# Patient Record
Sex: Female | Born: 1945 | Race: White | Hispanic: No | Marital: Married | Smoking: Never smoker
Health system: Southern US, Community
[De-identification: ages and names within clinical notes are randomized; demographics above are authoritative.]

## PROBLEM LIST (undated history)

## (undated) DIAGNOSIS — H269 Unspecified cataract: Secondary | ICD-10-CM

## (undated) DIAGNOSIS — M8580 Other specified disorders of bone density and structure, unspecified site: Secondary | ICD-10-CM

## (undated) DIAGNOSIS — E785 Hyperlipidemia, unspecified: Secondary | ICD-10-CM

## (undated) DIAGNOSIS — F32A Depression, unspecified: Secondary | ICD-10-CM

## (undated) DIAGNOSIS — J189 Pneumonia, unspecified organism: Secondary | ICD-10-CM

## (undated) DIAGNOSIS — G2581 Restless legs syndrome: Secondary | ICD-10-CM

## (undated) DIAGNOSIS — R7302 Impaired glucose tolerance (oral): Secondary | ICD-10-CM

## (undated) DIAGNOSIS — I10 Essential (primary) hypertension: Secondary | ICD-10-CM

## (undated) DIAGNOSIS — M1990 Unspecified osteoarthritis, unspecified site: Secondary | ICD-10-CM

## (undated) HISTORY — DX: Restless legs syndrome: G25.81

## (undated) HISTORY — DX: Depression, unspecified: F32.A

## (undated) HISTORY — DX: Unspecified osteoarthritis, unspecified site: M19.90

## (undated) HISTORY — DX: Hyperlipidemia, unspecified: E78.5

## (undated) HISTORY — PX: CARPAL TUNNEL RELEASE: SHX101

## (undated) HISTORY — PX: SHOULDER SURGERY: SHX246

## (undated) HISTORY — DX: Impaired glucose tolerance (oral): R73.02

## (undated) HISTORY — PX: EYE SURGERY: SHX253

## (undated) HISTORY — DX: Pneumonia, unspecified organism: J18.9

## (undated) HISTORY — PX: KNEE SURGERY: SHX244

## (undated) HISTORY — DX: Unspecified cataract: H26.9

## (undated) HISTORY — DX: Other specified disorders of bone density and structure, unspecified site: M85.80

## (undated) HISTORY — DX: Essential (primary) hypertension: I10

---

## 1998-10-13 ENCOUNTER — Ambulatory Visit (HOSPITAL_COMMUNITY): Admission: RE | Admit: 1998-10-13 | Discharge: 1998-10-13 | Payer: Self-pay | Admitting: Obstetrics & Gynecology

## 1998-11-27 ENCOUNTER — Other Ambulatory Visit: Admission: RE | Admit: 1998-11-27 | Discharge: 1998-11-27 | Payer: Self-pay | Admitting: Obstetrics & Gynecology

## 1999-04-15 ENCOUNTER — Other Ambulatory Visit: Admission: RE | Admit: 1999-04-15 | Discharge: 1999-04-15 | Payer: Self-pay | Admitting: Obstetrics & Gynecology

## 1999-08-30 ENCOUNTER — Other Ambulatory Visit: Admission: RE | Admit: 1999-08-30 | Discharge: 1999-08-30 | Payer: Self-pay | Admitting: Obstetrics and Gynecology

## 1999-11-16 ENCOUNTER — Encounter: Payer: Self-pay | Admitting: Obstetrics and Gynecology

## 1999-11-16 ENCOUNTER — Encounter: Admission: RE | Admit: 1999-11-16 | Discharge: 1999-11-16 | Payer: Self-pay | Admitting: Obstetrics and Gynecology

## 1999-12-01 ENCOUNTER — Encounter: Admission: RE | Admit: 1999-12-01 | Discharge: 1999-12-01 | Payer: Self-pay | Admitting: Obstetrics and Gynecology

## 1999-12-01 ENCOUNTER — Encounter: Payer: Self-pay | Admitting: Obstetrics and Gynecology

## 2000-10-10 ENCOUNTER — Other Ambulatory Visit: Admission: RE | Admit: 2000-10-10 | Discharge: 2000-10-10 | Payer: Self-pay | Admitting: Obstetrics and Gynecology

## 2001-02-23 ENCOUNTER — Encounter: Admission: RE | Admit: 2001-02-23 | Discharge: 2001-02-23 | Payer: Self-pay | Admitting: Obstetrics and Gynecology

## 2001-02-23 ENCOUNTER — Encounter: Payer: Self-pay | Admitting: Obstetrics and Gynecology

## 2001-08-31 ENCOUNTER — Other Ambulatory Visit: Admission: RE | Admit: 2001-08-31 | Discharge: 2001-08-31 | Payer: Self-pay | Admitting: Obstetrics and Gynecology

## 2001-09-22 ENCOUNTER — Encounter: Admission: RE | Admit: 2001-09-22 | Discharge: 2001-09-22 | Payer: Self-pay | Admitting: Obstetrics and Gynecology

## 2001-09-22 ENCOUNTER — Encounter: Payer: Self-pay | Admitting: Obstetrics and Gynecology

## 2001-12-22 ENCOUNTER — Observation Stay (HOSPITAL_COMMUNITY): Admission: RE | Admit: 2001-12-22 | Discharge: 2001-12-24 | Payer: Self-pay | Admitting: Obstetrics and Gynecology

## 2001-12-22 ENCOUNTER — Encounter (INDEPENDENT_AMBULATORY_CARE_PROVIDER_SITE_OTHER): Payer: Self-pay | Admitting: Specialist

## 2002-01-22 ENCOUNTER — Encounter: Admission: RE | Admit: 2002-01-22 | Discharge: 2002-01-22 | Payer: Self-pay | Admitting: *Deleted

## 2002-01-22 ENCOUNTER — Encounter: Payer: Self-pay | Admitting: *Deleted

## 2002-09-18 ENCOUNTER — Other Ambulatory Visit: Admission: RE | Admit: 2002-09-18 | Discharge: 2002-09-18 | Payer: Self-pay | Admitting: Obstetrics and Gynecology

## 2002-10-23 ENCOUNTER — Ambulatory Visit (HOSPITAL_COMMUNITY): Admission: RE | Admit: 2002-10-23 | Discharge: 2002-10-23 | Payer: Self-pay | Admitting: Gastroenterology

## 2004-09-01 ENCOUNTER — Encounter: Admission: RE | Admit: 2004-09-01 | Discharge: 2004-09-01 | Payer: Self-pay | Admitting: Obstetrics and Gynecology

## 2005-11-29 ENCOUNTER — Encounter: Admission: RE | Admit: 2005-11-29 | Discharge: 2005-11-29 | Payer: Self-pay | Admitting: Obstetrics and Gynecology

## 2008-04-01 ENCOUNTER — Encounter: Admission: RE | Admit: 2008-04-01 | Discharge: 2008-04-01 | Payer: Self-pay | Admitting: Obstetrics and Gynecology

## 2010-06-23 ENCOUNTER — Encounter: Admission: RE | Admit: 2010-06-23 | Discharge: 2010-06-23 | Payer: Self-pay | Admitting: Obstetrics and Gynecology

## 2011-01-10 ENCOUNTER — Encounter: Payer: Self-pay | Admitting: Obstetrics and Gynecology

## 2011-01-20 ENCOUNTER — Ambulatory Visit: Payer: Self-pay | Admitting: Ophthalmology

## 2011-05-07 NOTE — Op Note (Signed)
   NAME:  Charlene Barnes, Charlene Barnes                         ACCOUNT NO.:  [id number]   MEDICAL RECORD NO.:  [id number]                   PATIENT TYPE:  AMB   LOCATION:  ENDO                                 FACILITY:  [facility]   PHYSICIAN:  Anselmo Rod, M.D.               DATE OF BIRTH:  08/07/1946   DATE OF PROCEDURE:  10/23/2002  DATE OF DISCHARGE:                                 OPERATIVE REPORT   PROCEDURE:  Screening colonoscopy.   ENDOSCOPE:  Anselmo Rod, M.D.   INSTRUMENT USED:  Olympus video colonoscope.   INDICATION FOR PROCEDURE:  A 66 year old white female with a history of  rectal bleeding.  Rule out colonic polyps, masses, hemorrhoids, etc.   PREPROCEDURE PREPARATION:  Informed consent was procured from the patient.  The patient was fasted for eight hours prior to the procedure and prepped  with a bottle of magnesium citrate and a gallon of NuLytely the night prior  to the procedure.   PREPROCEDURE PREPARATION:  VITAL SIGNS:  The patient had stable vital signs.  NECK:  Supple.  CHEST:  Clear to auscultation.  S1, S2 regular.  ABDOMEN:  Soft with normal bowel sounds.   DESCRIPTION OF PROCEDURE:  The patient was placed in the left lateral  decubitus position and sedated with 60 mg of Demerol and 6 mg of Versed  intravenously.  Once the patient was adequately sedate and maintained on low-  flow oxygen and continuous cardiac monitoring, the Olympus video colonoscope  was advanced from the rectum to the cecum and terminal ileum without  difficulty.  The entire colonic mucosa appeared healthy with a normal  vascular pattern.  No masses, polyps, erosions, ulcerations, or diverticula  were seen.  The appendiceal orifice and the ileocecal valve were clearly  visualized and photographed.  Small, nonbleeding internal hemorrhoids were  seen on retroflexion.   IMPRESSION:  Normal colonoscopy up to the terminal ileum except for small  internal hemorrhoids.   RECOMMENDATIONS:  1.  A high-fiber diet has been discussed with the patient in great detail,     and brochures have been given to her for her education.  2.     A repeat colorectal cancer screening is recommended in the next five to 10     years unless the patient develops any abnormal symptoms in the interim.  3. Outpatient follow-up on a p.r.n. basis.                                               Anselmo Rod, M.D.    JNM/MEDQ  D:  10/23/2002  T:  10/23/2002  Job:  [id number]   cc:   Christella Noa, M.D.  [address]  [place], [place] [address]  Fax: [phone]

## 2011-05-07 NOTE — H&P (Signed)
[facility]  Patient:    Charlene Barnes, ARMIJO Visit Number: [id number] MRN: [id number]          Service Type: Attending:  Alvino Chapel, M.D. Dictated by:   Alvino Chapel, M.D. Adm. Date:  12/22/01                           History and Physical  SURGERY TIME:  December 22, 2000, at 7:30 a.m.  HISTORY OF PRESENT ILLNESS:  The patient is a 65 year old G2, P2, who has a long history of abnormal postmenopausal bleeding and a fibroid uterus. Approximately three years ago, the patient began to have spotting for the majority of her days each month and was tried on multiple medical regimens without success.  An FSH level confirmed that the patient was postmenopausal; however, she continues to have spotting for 20 out of 30 days of each month. An endometrial biopsy was performed and was negative.  A ultrasound was performed and showed a fibroid uterus measuring approximately 10 cm.  The ovaries appeared normal.  Given the patients ongoing problems and failure of medical therapy, she desires definitive surgical treatment with a laparoscopic-assisted vaginal hysterectomy and bilateral salpingo-oophorectomy.  The patient also reports some increasing symptoms of pelvic pressure and constipation.  Upon examination, it is noted that she has a moderate cystocele and small rectocele.  She would like these corrected at the time of surgery.  PAST MEDICAL HISTORY:  Significant only for restless leg syndrome.  PAST GYNECOLOGIC HISTORY:  Remote history of abnormal Pap smears; however, these normalized spontaneously.  PAST OBSTETRICAL HISTORY:  She has had two normal spontaneous vaginal deliveries.  PAST SURGICAL HISTORY:  None.  CURRENT MEDICATIONS:  Mirapex and Paxil.  FAMILY HISTORY:  There is no breast cancer or colon cancer in the family as well as no heart disease.  ALLERGIES:  None.  PHYSICAL EXAMINATION:  VITAL SIGNS:  Weight 165 pounds, blood  pressure 130/90.  BREASTS:  No masses, discharge, or adenopathy noted.  CARDIAC:  Regular rate and rhythm.  LUNGS:  Clear.  ABDOMEN:  Soft and nontender.  PELVIC:  The patient does have a moderate cystocele which enlarges with Valsalva.  The uterus itself is upper limits of normal, and the adnexa have no significant masses.  RECTAL:  There is also a small rectocele palpable on rectal exam.  PLAN:  All options were discussed with the patient, and she desires to proceed with definitive surgical therapy with a hysterectomy.  Given that she is over 35 years old and postmenopausal, she also wishes removal of her ovaries.  The patient was counselled as to the risks and benefits of a laparoscopic-assisted vaginal hysterectomy including bleeding, infection, and possible damage to bowel and bladder.  The patient understands these risks and desires to proceed.  The patient was also counselled as to the risks of proceeding with an anterior posterior repair; however, given her increase in symptoms over the past few months, she desires to proceed with this procedure as well. Dictated by:   Alvino Chapel, M.D. Attending:  Alvino Chapel, M.D. DD:  12/21/01 TD:  12/21/01 Job: [id number]

## 2011-05-07 NOTE — Op Note (Signed)
[facility]  Patient:    Charlene Barnes, Charlene Barnes Visit Number: [id number] MRN: [id number]          Service Type: SUR Location: 4W 0457 02 Attending Physician:  Oliver Pila Dictated by:   Alvino Chapel, M.D. Proc. Date: 12/22/01 Admit Date:  12/22/2001                             Operative Report  PREOPERATIVE DIAGNOSES: 1. Fibroid uterus. 2. Postmenopausal bleeding. 3. Cystocele. 4. Rectocele.  POSTOPERATIVE DIAGNOSES: 1. Fibroid uterus. 2. Postmenopausal bleeding. 3. Cystocele. 4. Rectocele.  PROCEDURE: 1. Laparoscopic assisted vaginal hysterectomy with bilateral    salpingo-oophorectomy. 2. Anterior and posterior repair.  SURGEON:  Alvino Chapel, M.D.  ASSISTANT:  Zenaida Niece, M.D.  ANESTHESIA:  General.  FINDINGS:  There is a fibroid uterus, 10 to 12 weeks in size which was significantly retroverted. Normal ovaries and tubes were noted bilaterally. There was a moderate cystocele and a mild rectocele.  ESTIMATED BLOOD LOSS:  250 cc.  URINE OUTPUT:  600 cc, clear.  INTRAVENOUS FLUIDS:  2700 cc lactated Ringers.  PATHOLOGY:  Uterus, tubes, ovaries and cervix were sent to pathology.  DESCRIPTION OF PROCEDURE:  The patient was taken to the operating room where general anesthesia was obtained without difficulty. She was then prepped and draped in the normal sterile fashion in the dorsal lithotomy position. Speculum was placed within the vagina and a Hulka tenaculum placed within the cervix for uterine manipulation. The speculum was then removed and attention then turned to the patients abdomen. A small infraumbilical incision was made with the scalpel and the Veress needle introduced into this incision. Intraperitoneal placement was confirmed by both injection and aspiration with normal saline. Gas flow was then applied and pneumoperitoneum obtained with 3.5 liters of CO2 gas. The patient was then placed in  Trendelenburg and the trocar placed through the incision without difficulty. The camera was then introduced and pelvis and abdomen were inspected with the findings as previously stated. Two additional ports were then placed in the lower quadrants under direct visualization, 5 mm in size. Each area was injected with 0.25% Marcaine prior to placement of trocars. Thus with access obtained, the patients right fallopian tube and ovary were grasped medially and the infundibulopelvic ligament and broad ligament were taken down sequentially with the bipolar cutting cautery unit. This was taken down through the round ligament as well and the bladder flap was created with the instrument. In a similar fashion, the left ovary and tube were grasped and pulled medially. This was then also taken down with bipolar cutting cautery unit across the infundibulopelvic, broad, and round ligaments. A bladder flap was also created on this side and the bladder was able to be pushed inferior to the uterus. With no active bleeding noted, the camera was removed from the abdomen. A clean towel was placed across the trocars.  Attention was then turned to the patients vagina where the cervix was grasped with Christella Hartigan tenaculum and injected circumferentially with a dilute solution of Pitressin. The cervix was then incised in a circumferential fashion with Bovie cautery. The Mayo scissors were then used to continue to dissect away the overlying vaginal mucosa from the cervix. The posterior cul-de-sac was entered with Mayo scissors and the Bonnano speculum within the posterior cuff. The slightly curved Zeppelin clamps were then used to clamp each uterosacral ligaments which was transected and secured with a  0 Vicryl suture. The remainder of the cardinal and broad ligament was then taken down with slight curved Zeppelin clamps and sequential bites with each step secured with 0 Vicryl. This was taken until the uterus was  completely free and then removed from the abdomen. The small areas of bleeding along the angles of the cuff were controlled with figure-of-eight sutures of 0 Vicryl. The cuff itself was stitched in a running locked stitch along the posterior edge for hemostasis. The peritoneum was then closed in a pursestring fashion with 0 Vicryl and the cuff was closed with interrupted sutures of 0 Vicryl with good hemostasis obtained.  The anterior wall of the vagina was then grasped just anterior to the closed vaginal cuff and was opened medially with the Metzenbaum scissors. The mucosa was opened up to approximately 1 cm below the urethral meatus and the pubovesical fascia was trimmed away from the mucosa bilaterally. Several interrupted sutures of 0 Vicryl were then placed in the pubovesical fascia to reduce the cystocele and the vaginal mucosa was closed with a running locked suture of 0 swaged-on.  Attention was then turned to the posterior surface of the vagina and the speculum was removed. This was injected with a dilute solution of Pitressin and grasped with Allis clamps at the introitus. This was then opened and the vaginal mucosa was incised along the median of the posterior vaginal wall. These flaps were elevated and the underlying fascia trimmed away with Mayo scissors. Several sutures were placed to reduce the rectocele in the pubovesical fascia of 0 interrupted Vicryl and the excess mucosa trimmed away. The mucosa itself was then closed with 0 Vicryl in a running locked fashion. Two hymenal tags at the perineum were removed and closed.  At this point, the vagina was packed with iodoform gauze for hemostasis. There was no active bleeding noted along the anterior-posterior surface or the vaginal cuff itself. Attention was then returned to the patients abdomen where gas flow was again applied and pneumoperitoneum obtained. The vaginal cuff was inspected closely and was found to be  hemostatic. All the infundibulopelvic and round ligaments were also inspected and found to be  hemostatic. There was one small hematoma along the left pelvic sidewall, however, this was small and did not appear to be expanding. Thus, with hemostasis confirmed, all the 5 mm trocars were removed under direct visualization with no bleeding noted at the trocar sites. The pneumoperitoneum was then evacuated through the umbilical trocar and this, too, was removed under direct visualization. The umbilical incision was then closed with 0 Vicryl and two subcutaneous stitches and the skin was closed with 3-0 Vicryl in a subcuticular stitch at all sites. Sponge lap and needle counts were correct x 2 and the patient was taken to the recovery room in stable condition. Dictated by:   Alvino Chapel, M.D. Attending Physician:  Oliver Pila DD:  12/22/01 TD:  12/22/01 Job: [id number]

## 2011-05-07 NOTE — Discharge Summary (Signed)
[facility]  Patient:    BAILEI, BUIST Visit Number: [id number] MRN: [id number]          Service Type: SUR Location: 4W 0457 02 Attending Physician:  Oliver Pila Dictated by:   Malachi Pro. Ambrose Mantle, M.D. Admit Date:  12/22/2001 Discharge Date: 12/24/2001                             Discharge Summary  HOSPITAL COURSE:  This is a 65 year old, white female admitted with cystocele, fibroid uterus, postmenopausal bleeding and rectocele for a laparoscopic-assisted vaginal hysterectomy, bilateral salpingo-oophorectomy with A&P repair.  The procedure was done by Dr. Senaida Ores with Dr. Jackelyn Knife assisting under general anesthesia with findings of a 10- to 12-week size fibroid uterus, normal tubes and ovaries, moderate cystocele and mild rectocele.  Postoperatively, the patient did fine; however, she did have a drop in blood pressure postoperatively that was attributed to the Dilaudid pump.  Once she came off the Dilaudid pump, she did better.  She had no significant problems.  On the day of discharge, her temperature was actually 100.4 degrees and when it was rechecked, it was 97.  The patient is having no symptoms and she is just advised to watch her temperature elevation and call if her temperature goes above 100.4 degrees.  LABORATORY DATA AND X-RAY FINDINGS:  White count 6900, hemoglobin 13.0, hematocrit 38.2, platelet count 331,000 with 55 segs, 35 lymphs, 8 monos, 1 eos and 1 baso.  Followup hemoglobin was 11.2, hematocrit 32.6.  EKG was normal except for a sinus bradycardia.  Pathology report is pending.  DISCHARGE DIAGNOSES: 1. Leiomyomata uteri. 2. Cystocele and rectocele. 3. Postmenopausal bleeding.  PROCEDURE:  Laparoscopic-assisted vaginal hysterectomy with bilateral salpingo-oophorectomy with anterior and posterior repair.  CONDITION ON DISCHARGE:  Improved.  DIET:  Regular.  ACTIVITY:  No vaginal entrance.  No heavy lifting  or strenuous activity.  SPECIAL INSTRUCTIONS:  Call if her temperature goes above 100.4 degrees and return to the office in two weeks for followup examination.  DISCHARGE MEDICATIONS: 1. Percocet. 2. Motrin. Dictated by:   Malachi Pro. Ambrose Mantle, M.D. Attending Physician:  Oliver Pila DD:  12/24/01 TD:  12/25/01 Job: [id number]

## 2013-08-27 ENCOUNTER — Other Ambulatory Visit: Payer: Self-pay

## 2013-08-27 DIAGNOSIS — Z1231 Encounter for screening mammogram for malignant neoplasm of breast: Secondary | ICD-10-CM

## 2013-09-10 ENCOUNTER — Ambulatory Visit
Admission: RE | Admit: 2013-09-10 | Discharge: 2013-09-10 | Disposition: A | Discharge status: Home / Self Care | Payer: BC Managed Care – PPO | Source: Ambulatory Visit

## 2013-09-10 DIAGNOSIS — Z1231 Encounter for screening mammogram for malignant neoplasm of breast: Secondary | ICD-10-CM

## 2013-11-23 ENCOUNTER — Telehealth: Payer: Self-pay | Admitting: Neurology

## 2013-11-23 NOTE — Telephone Encounter (Signed)
calling to get reassigned and scheduled

## 2013-11-26 NOTE — Telephone Encounter (Signed)
Given to Joellen Jersey for reassignment

## 2013-11-29 ENCOUNTER — Telehealth: Payer: Self-pay | Admitting: Diagnostic Neuroimaging

## 2013-11-29 NOTE — Telephone Encounter (Signed)
Left message for patient to call and schedule appointment. Former Dr. Sandria Manly patient, transfer of care to Dr. Marjory Lies.

## 2014-05-29 ENCOUNTER — Telehealth: Payer: Self-pay

## 2014-05-29 NOTE — Telephone Encounter (Signed)
Spoke to patient. Scheduled appt for 05/30/14. Pt was on wait list.

## 2014-05-30 ENCOUNTER — Ambulatory Visit (INDEPENDENT_AMBULATORY_CARE_PROVIDER_SITE_OTHER): Payer: BC Managed Care – PPO | Admitting: Diagnostic Neuroimaging

## 2014-05-30 ENCOUNTER — Encounter (INDEPENDENT_AMBULATORY_CARE_PROVIDER_SITE_OTHER): Payer: Self-pay

## 2014-05-30 ENCOUNTER — Encounter: Payer: Self-pay | Admitting: Diagnostic Neuroimaging

## 2014-05-30 VITALS — BP 141/62 | HR 62 | Temp 101.4°F | Ht 64.0 in | Wt 139.0 lb

## 2014-05-30 DIAGNOSIS — Z726 Gambling and betting: Secondary | ICD-10-CM

## 2014-05-30 DIAGNOSIS — G2581 Restless legs syndrome: Secondary | ICD-10-CM

## 2014-05-30 DIAGNOSIS — F639 Impulse disorder, unspecified: Secondary | ICD-10-CM

## 2014-05-30 DIAGNOSIS — F429 Obsessive-compulsive disorder, unspecified: Secondary | ICD-10-CM

## 2014-05-30 NOTE — Patient Instructions (Signed)
I will refer you to psychiatry.  After other symptoms are stabilized, we may change your RLS medications.

## 2014-05-30 NOTE — Progress Notes (Signed)
[facility]  PATIENT: Charlene Barnes DOB: September 01, 1946  REFERRING CLINICIAN:  HISTORY FROM: patient  REASON FOR VISIT: follow up (Dr. Sandria Manly transfer)   HISTORICAL  CHIEF COMPLAINT:  Chief Complaint  Patient presents with  . Follow-up    RLS    HISTORY OF PRESENT ILLNESS:   UPDATE 05/30/14: Here for follow up. RLS is stable. Still feels some tendency to move at night. Lately, having more urges to shop and gamble. Doesn't know why. Mood is stable. Has been on paxil x 20 yrs (used to get easily tearful at work).   PRIOR HPI (04/24/12, Dr. Sandria Manly): 68 year old right-handed white married female with a many year history of restless leg syndrome followed on a yearly basis. She  had low serum ferritin of 7 and was on iron supplement without significant benefit. 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 She had shopping sprees Her mother had Parkinson's disease and  her brother and maternal cousins have restless leg syndrome. She had augmentation with Sinemet and poor response to Requip. She does well with pramipaxol 1.5 mg once at night and sleeps 4/7 nights. If she waits to late  to take her medicine, it is less effective .When she is exhausted she cannot go to sleep. She denies daytime sleepiness or restless legs. She has periodic leg movements at night. She was tried on Neupro  by Dr. Wylene Simmer, dosage unknown, but  did not take it after 4 days. Her symptoms got worse. She is  taking pramipaxole 1.5 mg at 8 PM. If she develops severe restless legs, she will get out of bedand walk or go into the kitchen and he  becomes take a hot shower. She rubs her legs in lotion at night.Her symptoms are worse when she is tired. She has noted difficulty processing and focusing at work. She notes pain in her upper neck  posteriorly which is not radiating to her arms.   REVIEW OF SYSTEMS: Full 14 system review of systems performed and notable only for as per HPI, otherwise neg.    ALLERGIES: Allergies  Allergen Reactions  . Adhesive [Tape]     HOME MEDICATIONS: No outpatient prescriptions prior to visit.   No facility-administered medications prior to visit.    PAST MEDICAL HISTORY: History reviewed. No pertinent past medical history.  PAST SURGICAL HISTORY: History reviewed. No pertinent past surgical history.  FAMILY HISTORY: Family History  Problem Relation Age of Onset  . Parkinsonism Mother   . Lymphoma Mother   . Other Father     natural  . Restless legs syndrome Brother     SOCIAL HISTORY:  History   Social History  . Marital Status: Married    Spouse Name: Adriana Simas    Number of Children: 2  . Years of Education: College   Occupational History  . retired    Social History Main Topics  . Smoking status: Never Smoker   . Smokeless tobacco: Not on file  . Alcohol Use: Yes     Comment: occasionally  . Drug Use: No  . Sexual Activity: Not on file   Other Topics Concern  . Not on file   Social History Narrative   Patient lives at home with spouse.   Caffeine Use: 1-2 cups daily     PHYSICAL EXAM  Filed Vitals:   05/30/14 1431  BP: 141/62  Pulse: 62  Temp: 101.4 F (38.6 C)  TempSrc: Oral  Height: 5\' 4"  (1.626 m)  Weight: 139 lb (63.05 kg)    Not recorded    Body mass index is 23.85 kg/(m^2).  GENERAL EXAM: Patient is in no distress; well developed, nourished and groomed; neck is supple  CARDIOVASCULAR: Regular rate and rhythm, no murmurs, no carotid bruits  NEUROLOGIC: MENTAL STATUS: awake, alert, language fluent, comprehension intact, naming intact, fund of knowledge appropriate CRANIAL NERVE: no papilledema on fundoscopic exam, pupils equal and reactive to light, visual fields full to confrontation, extraocular muscles intact, no nystagmus, facial sensation and strength symmetric, hearing intact, palate elevates symmetrically, uvula midline, shoulder shrug symmetric, tongue midline. MOTOR: normal bulk and  tone, full strength in the BUE, BLE SENSORY: normal and symmetric to light touch COORDINATION: finger-nose-finger, fine finger movements normal REFLEXES: deep tendon reflexes present and symmetric GAIT/STATION: narrow based gait; romberg is negative    DIAGNOSTIC DATA (LABS, IMAGING, TESTING) - I reviewed patient records, labs, notes, testing and imaging myself where available.  No results found for this basename: WBC, HGB, HCT, MCV, PLT   No results found for this basename: na, k, cl, co2, glucose, bun, creatinine, calcium, prot, albumin, ast, alt, alkphos, bilitot, gfrnonaa, gfraa   No results found for this basename: CHOL, HDL, LDLCALC, LDLDIRECT, TRIG, CHOLHDL   No results found for this basename: HGBA1C   No results found for this basename: VITAMINB12   No results found for this basename: TSH      ASSESSMENT AND PLAN  68 y.o. year old female here with RLS and impulse control problem (gambling, shopping; eating in the past), which may be related to her dopamine agonist + some underlying issues.  PLAN: - psychiatry eval of OCD, impulse control, gambling, shopping problems - advised to reduce pramipexole to 1mg  qhs, but she wants to keep it the same for now  Orders Placed This Encounter  Procedures  . Ambulatory referral to Psychiatry   Return in about 3 months (around 08/30/2014).    Suanne MarkerVIKRAM R. Paisyn Guercio, MD 05/30/2014, 3:22 PM Certified in Neurology, Neurophysiology and Neuroimaging  [facility] [address], [address] [place], [place] [address] [phone]

## 2014-06-03 ENCOUNTER — Telehealth: Payer: Self-pay | Admitting: *Deleted

## 2014-06-03 NOTE — Telephone Encounter (Signed)
Called patient and asked if she could have the office (Urgent Care) where she was diagnosed to fax the report to our office for her chart here, she verbalized understanding and said that she would

## 2014-07-23 ENCOUNTER — Ambulatory Visit: Payer: Self-pay | Admitting: Diagnostic Neuroimaging

## 2015-03-19 ENCOUNTER — Other Ambulatory Visit: Payer: Self-pay

## 2015-03-19 DIAGNOSIS — Z1231 Encounter for screening mammogram for malignant neoplasm of breast: Secondary | ICD-10-CM

## 2015-03-27 ENCOUNTER — Ambulatory Visit: Payer: Self-pay

## 2015-03-27 ENCOUNTER — Ambulatory Visit
Admission: RE | Admit: 2015-03-27 | Discharge: 2015-03-27 | Disposition: A | Discharge status: Home / Self Care | Payer: BLUE CROSS/BLUE SHIELD | Source: Ambulatory Visit

## 2015-03-27 DIAGNOSIS — Z1231 Encounter for screening mammogram for malignant neoplasm of breast: Secondary | ICD-10-CM

## 2016-05-18 ENCOUNTER — Other Ambulatory Visit: Payer: Self-pay

## 2016-05-18 DIAGNOSIS — Z1231 Encounter for screening mammogram for malignant neoplasm of breast: Secondary | ICD-10-CM

## 2016-05-20 ENCOUNTER — Ambulatory Visit
Admission: RE | Admit: 2016-05-20 | Discharge: 2016-05-20 | Disposition: A | Discharge status: Home / Self Care | Payer: Medicare Other | Source: Ambulatory Visit

## 2016-05-20 DIAGNOSIS — Z1231 Encounter for screening mammogram for malignant neoplasm of breast: Secondary | ICD-10-CM

## 2016-05-25 ENCOUNTER — Other Ambulatory Visit: Payer: Self-pay | Admitting: Internal Medicine

## 2016-05-25 DIAGNOSIS — R928 Other abnormal and inconclusive findings on diagnostic imaging of breast: Secondary | ICD-10-CM

## 2016-06-03 ENCOUNTER — Ambulatory Visit
Admission: RE | Admit: 2016-06-03 | Discharge: 2016-06-03 | Disposition: A | Discharge status: Home / Self Care | Payer: Medicare Other | Source: Ambulatory Visit | Attending: Internal Medicine | Admitting: Internal Medicine

## 2016-06-03 DIAGNOSIS — R928 Other abnormal and inconclusive findings on diagnostic imaging of breast: Secondary | ICD-10-CM

## 2017-05-20 ENCOUNTER — Other Ambulatory Visit: Payer: Self-pay | Admitting: Internal Medicine

## 2017-05-20 DIAGNOSIS — Z1231 Encounter for screening mammogram for malignant neoplasm of breast: Secondary | ICD-10-CM

## 2017-05-23 ENCOUNTER — Ambulatory Visit
Admission: RE | Admit: 2017-05-23 | Discharge: 2017-05-23 | Disposition: A | Discharge status: Home / Self Care | Payer: Medicare Other | Source: Ambulatory Visit | Attending: Internal Medicine | Admitting: Internal Medicine

## 2017-05-23 DIAGNOSIS — Z1231 Encounter for screening mammogram for malignant neoplasm of breast: Secondary | ICD-10-CM

## 2018-11-10 ENCOUNTER — Other Ambulatory Visit: Payer: Self-pay | Admitting: Internal Medicine

## 2018-11-10 DIAGNOSIS — Z1231 Encounter for screening mammogram for malignant neoplasm of breast: Secondary | ICD-10-CM

## 2018-12-26 ENCOUNTER — Ambulatory Visit
Admission: RE | Admit: 2018-12-26 | Discharge: 2018-12-26 | Disposition: A | Discharge status: Home / Self Care | Payer: Medicare Other | Source: Ambulatory Visit | Attending: Internal Medicine | Admitting: Internal Medicine

## 2018-12-26 DIAGNOSIS — Z1231 Encounter for screening mammogram for malignant neoplasm of breast: Secondary | ICD-10-CM

## 2019-12-21 HISTORY — PX: KNEE SURGERY: SHX244

## 2020-03-26 IMAGING — MG DIGITAL SCREENING BILATERAL MAMMOGRAM WITH TOMO AND CAD
8 series · 8 of 24 positions shown · non-contrast
Comparison: Previous exam(s).

CLINICAL DATA: Screening.

EXAM:
DIGITAL SCREENING BILATERAL MAMMOGRAM WITH TOMO AND CAD

[R CC synth-2D]
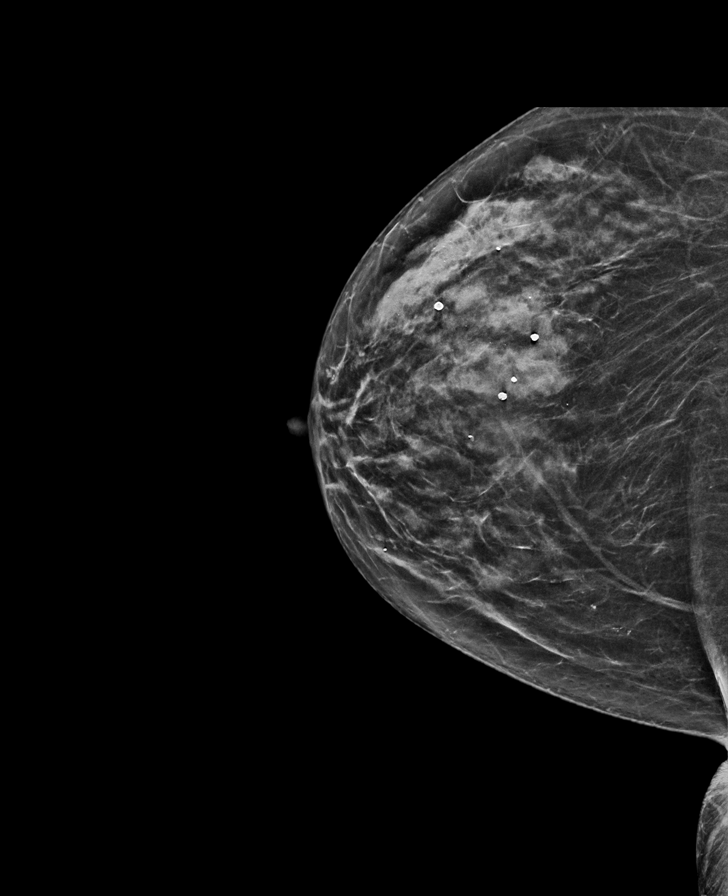

[R MLO synth-2D]
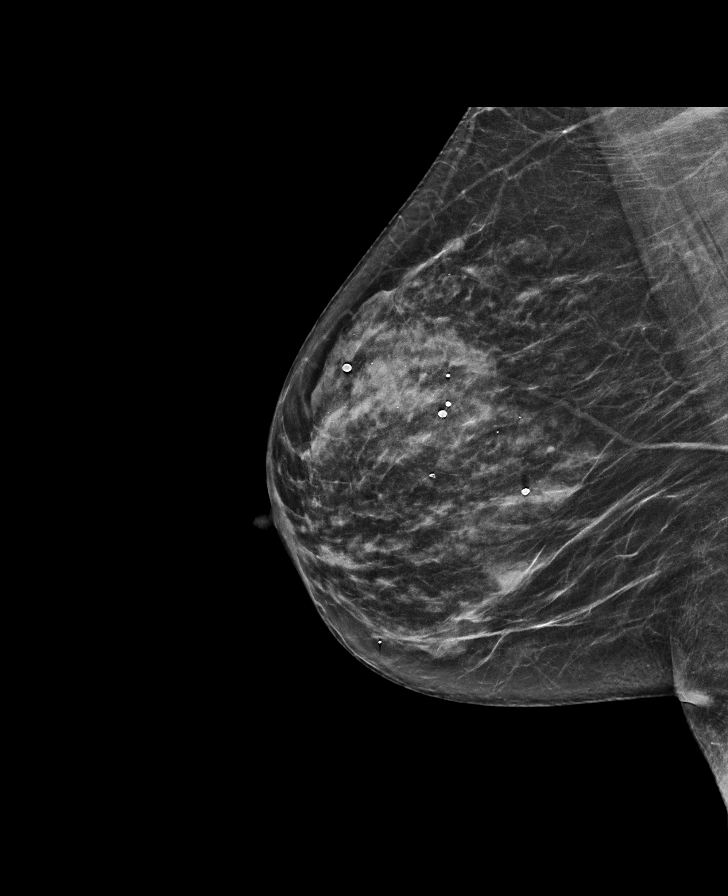

[L MLO synth-2D]
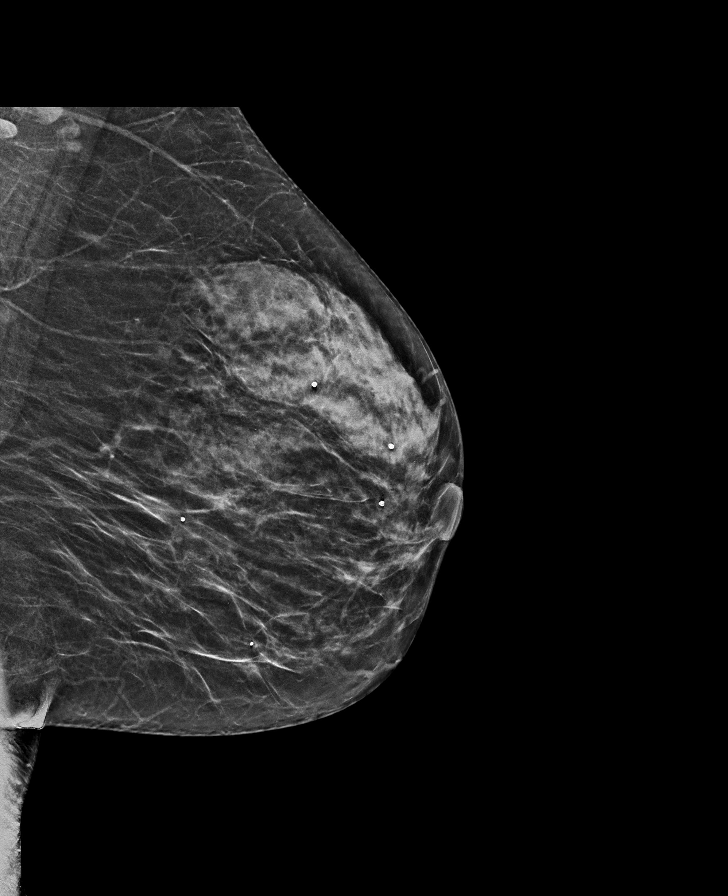

[L CC synth-2D]
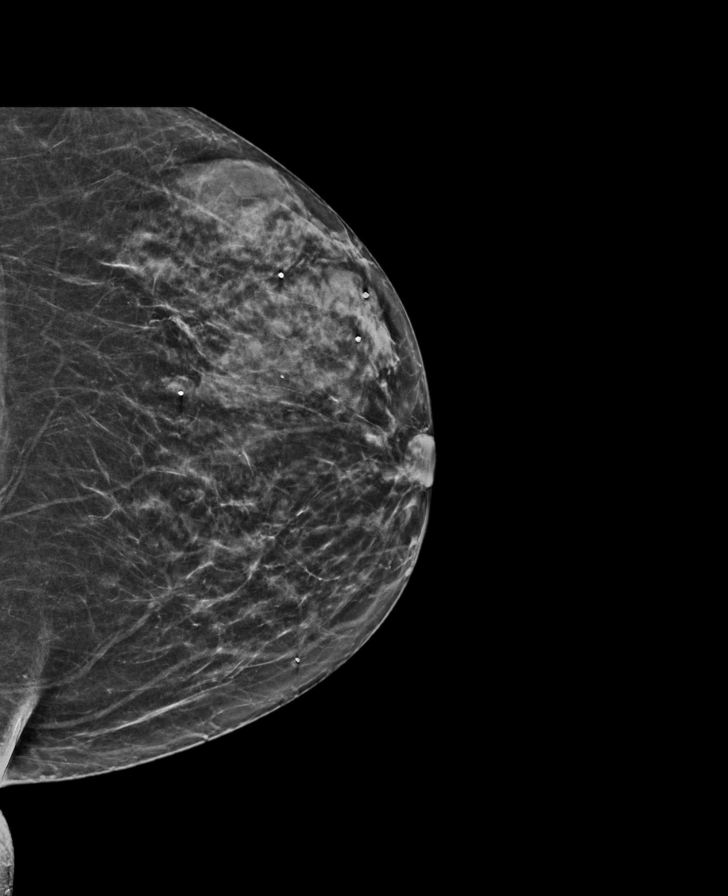

[L CC tomo · tomo slice 31/60.0]
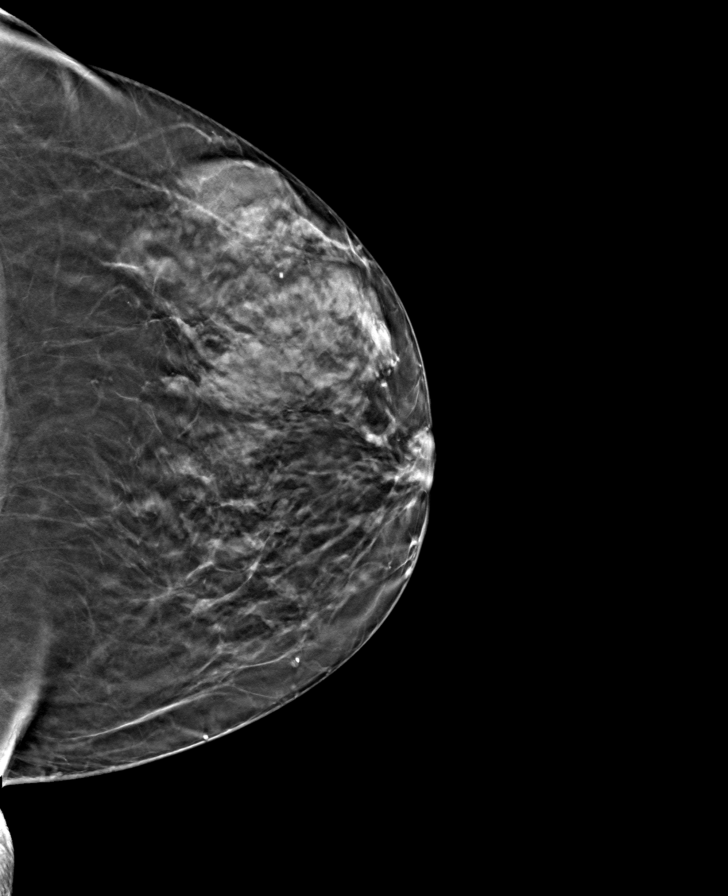

[R MLO tomo · tomo slice 31/61.0]
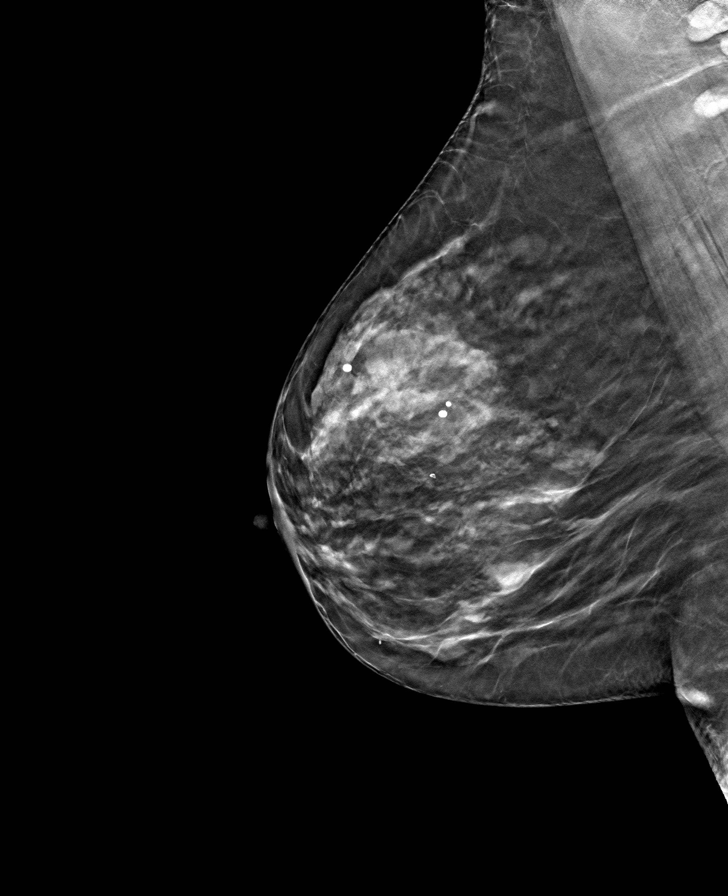

[R CC tomo · tomo slice 31/60.0]
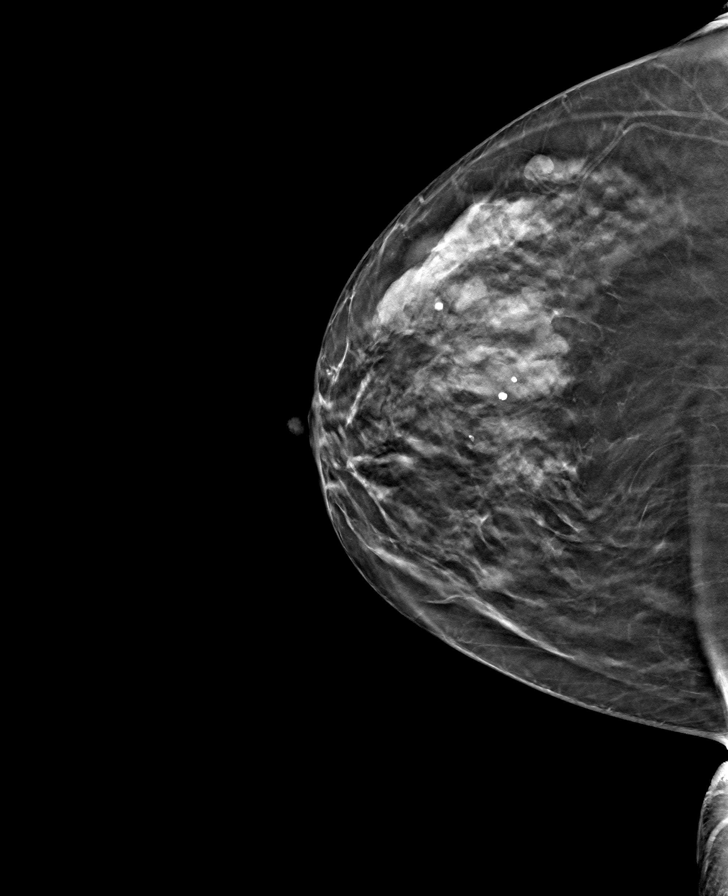

[L MLO tomo · tomo slice 29/57.0]
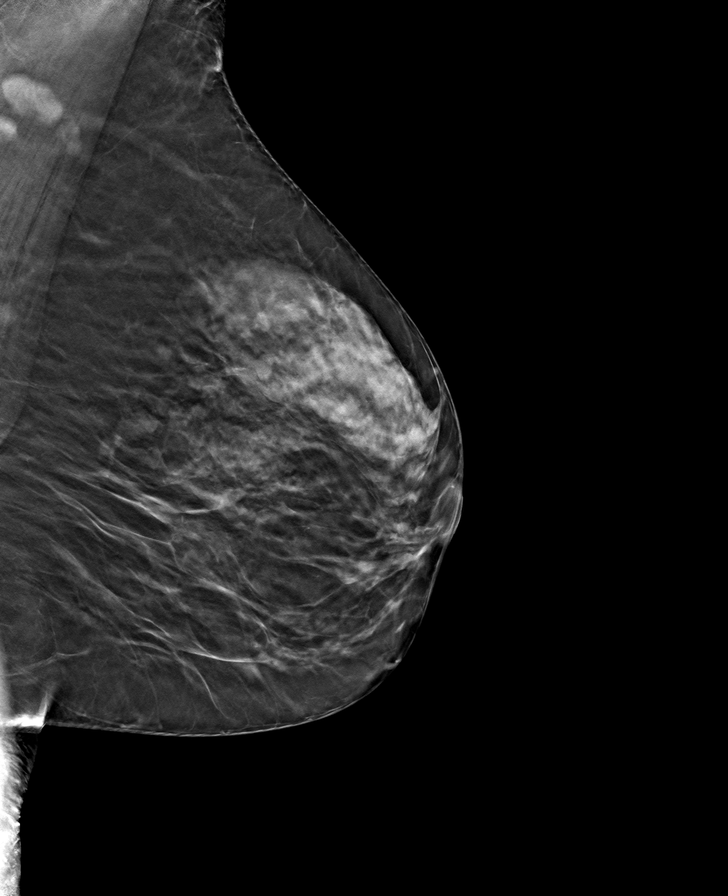

[8 of 24 positions shown; findings below may reference images not displayed]

ACR Breast Density Category c: The breast tissue is heterogeneously
dense, which may obscure small masses.
FINDINGS: There are no findings suspicious for malignancy. Images were
processed with CAD.
IMPRESSION: No mammographic evidence of malignancy. A result letter of this
screening mammogram will be mailed directly to the patient.

RECOMMENDATION:
Screening mammogram in one year. (Code:FT-U-LHB)

BI-RADS CATEGORY  1: Negative.

## 2020-06-02 ENCOUNTER — Other Ambulatory Visit: Payer: Self-pay | Admitting: Internal Medicine

## 2020-06-02 DIAGNOSIS — Z1231 Encounter for screening mammogram for malignant neoplasm of breast: Secondary | ICD-10-CM

## 2020-07-03 ENCOUNTER — Ambulatory Visit: Payer: Medicare Other

## 2020-07-09 ENCOUNTER — Ambulatory Visit: Payer: Medicare Other

## 2020-07-17 ENCOUNTER — Ambulatory Visit
Admission: RE | Admit: 2020-07-17 | Discharge: 2020-07-17 | Disposition: A | Discharge status: Home / Self Care | Payer: Medicare Other | Source: Ambulatory Visit | Attending: Internal Medicine | Admitting: Internal Medicine

## 2020-07-17 ENCOUNTER — Other Ambulatory Visit: Payer: Self-pay

## 2020-07-17 DIAGNOSIS — Z1231 Encounter for screening mammogram for malignant neoplasm of breast: Secondary | ICD-10-CM

## 2020-10-30 ENCOUNTER — Other Ambulatory Visit: Payer: Self-pay | Admitting: Internal Medicine

## 2020-10-30 DIAGNOSIS — R5381 Other malaise: Secondary | ICD-10-CM

## 2021-12-23 ENCOUNTER — Encounter: Payer: Self-pay | Admitting: *Deleted

## 2021-12-24 ENCOUNTER — Encounter: Payer: Self-pay | Admitting: Neurology

## 2021-12-24 ENCOUNTER — Ambulatory Visit: Payer: Medicare Other | Admitting: Neurology

## 2021-12-24 VITALS — BP 113/55 | HR 57 | Ht 63.0 in | Wt 146.0 lb

## 2021-12-24 DIAGNOSIS — G2581 Restless legs syndrome: Secondary | ICD-10-CM

## 2021-12-24 DIAGNOSIS — R011 Cardiac murmur, unspecified: Secondary | ICD-10-CM

## 2021-12-24 DIAGNOSIS — R0683 Snoring: Secondary | ICD-10-CM

## 2021-12-24 DIAGNOSIS — R0681 Apnea, not elsewhere classified: Secondary | ICD-10-CM

## 2021-12-24 DIAGNOSIS — G4719 Other hypersomnia: Secondary | ICD-10-CM | POA: Diagnosis not present

## 2021-12-24 DIAGNOSIS — G4761 Periodic limb movement disorder: Secondary | ICD-10-CM

## 2021-12-24 DIAGNOSIS — R001 Bradycardia, unspecified: Secondary | ICD-10-CM

## 2021-12-24 DIAGNOSIS — R351 Nocturia: Secondary | ICD-10-CM | POA: Diagnosis not present

## 2021-12-24 NOTE — Progress Notes (Signed)
Subjective:    Patient ID: Charlene Barnes is a 76 y.o. female.  HPI    Huston Foley, MD, PhD [facility] [address], [address] [address] [place], [place] [address]  Dear Dr. Wylene Simmer,  I saw your patient, Charlene Barnes, upon your kind request in my sleep clinic today for initial consultation of her sleep disorder, in particular, concern for underlying obstructive sleep apnea.  The patient is unaccompanied today.  As you know, 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 Sandria Manly, then Dr. Marjory Lies, and most recently through [facility] neurology, Dr. Trinda Pascal), who reports snoring and witnessed apneas per husband's feedback.  The symptoms have been going on for about 2 years or so.  She has a longstanding history of restless legs for years and is currently on Mirapex 1.5 mg at bedtime.  She also takes trazodone 100 mg at bedtime and feels that it has been helpful to help her maintain sleep.  She goes to bed typically around 8 PM and rise time is around 6 AM.  She has no recurrent morning headaches but has nocturia about twice per average night.  She has a history of bradycardia and I noticed a slight systolic murmur on exam today but she has not had a heart murmur before and has never seen cardiology.  She does report a longer standing history of bradycardia, heart rate may be in the high 40s or low 50s typically at rest. I reviewed your office note from 09/23/2021.  Her Epworth sleepiness score is 8 out of 24, fatigue severity score is 45 out of 63.  She reports a family history of restless legs and also moving her feet at night, also twitching while asleep which  can disturb her husband.  Sometimes they sleep in separate bedrooms.  She is retired, she lives with her husband.  They have 1 dog in the household.  She does have a TV in the bedroom but does not watch it at night.  She is essentially a non-smoker, smoked a little bit while in college.  She drinks alcohol in the form of beer or mixed drink, 1/day in the evening around 6 PM typically.  She drinks caffeine in the form of coffee, about 2 cups in the mornings.  For her longstanding history of restless leg syndrome, she has been tried on Neupro, Mirapex, Requip, gabapentin, and Sinemet in the past.  The medications were either not effective or resulted in augmentation or caused side effects including impulse control disorder, particularly on Mirapex, currently she is on Mirapex 1.5 mg daily.  Her Past Medical History Is Significant For: Past Medical History:  Diagnosis Date   Cataracts, bilateral    Depression    with OCD, impule disorder   Hyperlipidemia    Hypertension    Impaired glucose tolerance    Osteoarthritis    feet/ ankles   Osteopenia    hips   Restless legs syndrome    RLL pneumonia    02/2018    Her Past Surgical History Is Significant For: No past surgical history on file.  Her Family History Is Significant For: Family History  Problem Relation Age of Onset   Parkinsonism Mother    Lymphoma Mother  Hypertension Mother    Other Father        natural   Osteoarthritis Brother    Restless legs syndrome Brother    Osteoarthritis Brother    Restless legs syndrome Brother     Her Social History Is Significant For: Social History   Socioeconomic History   Marital status: Married    Spouse name: Adriana SimasCook   Number of children: 2   Years of education: College   Highest education level: Not on file  Occupational History   Occupation: retired  Tobacco Use   Smoking status: Never   Smokeless tobacco: Not on file  Vaping Use   Vaping Use: Never used  Substance and  Sexual Activity   Alcohol use: Yes    Comment: occasionally   Drug use: Not Currently    Types: Marijuana    Comment: on occasion   Sexual activity: Not on file  Other Topics Concern   Not on file  Social History Narrative   Patient lives at home with spouse.Caffeine Use: 1-2 cups daily. Working retired 2013.    Social Determinants of Health   Financial Resource Strain: Not on file  Food Insecurity: Not on file  Transportation Needs: Not on file  Physical Activity: Not on file  Stress: Not on file  Social Connections: Not on file    Her Allergies Are:  Allergies  Allergen Reactions   Adhesive [Tape]    Latex Rash  :   Her Current Medications Are:  Outpatient Encounter Medications as of 12/24/2021  Medication Sig   alendronate (FOSAMAX) 70 MG tablet TAKE 1 TABLET BY MOUTH  WEEKLY WITH 8 OZ OF PLAIN  WATER 30 MINUTES BEFORE  FIRST FOOD, DRINK OR MEDS.  STAY UPRIGHT FOR 30 MINS   amLODipine (NORVASC) 10 MG tablet Take 1 tablet by mouth daily.   Calcium Carbonate-Vitamin D (CALCIUM-VITAMIN D3) 600-3.125 MG-MCG TABS Take 1 tablet po qd (Calcium 1200mg / Vitamin D 1000iu)   diclofenac (VOLTAREN) 75 MG EC tablet    Diclofenac 35 MG CAPS One cap three times daily as needed.   OLMESARTAN MEDOXOMIL-HCTZ PO    PARoxetine (PAXIL) 20 MG tablet Take 20 mg by mouth daily.   pramipexole (MIRAPEX) 1.5 MG tablet Take 1.5 mg by mouth daily.   traZODone (DESYREL) 100 MG tablet Take 100 mg by mouth at bedtime.   celecoxib (CELEBREX) 100 MG capsule Take 100 mg by mouth 2 (two) times daily. (Patient not taking: Reported on 12/24/2021)   No facility-administered encounter medications on file as of 12/24/2021.  :   Review of Systems:  Out of a complete 14 point review of systems, all are reviewed and negative with the exception of these symptoms as listed below:  Review of Systems  Neurological:        Witnessed apnea by husband.  no previous sleep study or cpap.  Has woken her self up, breathing  deeply. ESS 8     FSS 45   Objective:  Neurological Exam  Physical Exam Physical Examination:   Vitals:   12/24/21 1104  BP: (!) 113/55  Pulse: (!) 57    General Examination: The patient is a very pleasant 76 y.o. female in no acute distress. She appears well-developed and well-nourished and well groomed.   HEENT: Normocephalic, atraumatic, pupils are equal, round and reactive to light, extraocular tracking is good without limitation to gaze excursion or nystagmus noted. Hearing is grossly intact. Face is symmetric with normal facial animation. Speech is clear with  no dysarthria noted. There is no hypophonia. There is no lip, neck/head, jaw or voice tremor. Neck is supple with full range of passive and active motion. There are no carotid bruits on auscultation. Oropharynx exam reveals: mild mouth dryness, good dental hygiene and mild airway crowding, due to slightly small airway entry.  Tonsils are absent.  Mallampati class I.  Neck circumference 14 inches, mild overbite noted. Tongue protrudes centrally and palate elevates symmetrically.   Chest: Clear to auscultation without wheezing, rhonchi or crackles noted.  Heart: S1+S2+0, regular, bradycardic, slight systolic murmur noted.  Abdomen: Soft, non-tender and non-distended.  Extremities: There is no obvious edema in the distal lower extremities bilaterally.   Skin: Warm and dry without trophic changes noted.   Musculoskeletal: exam reveals no obvious joint deformities, left upper extremity is in a sling, had recent left rotator cuff surgery.  Patient is status post left total knee replacement.   Neurologically:  Mental status: The patient is awake, alert and oriented in all 4 spheres. Her immediate and remote memory, attention, language skills and fund of knowledge are appropriate. There is no evidence of aphasia, agnosia, apraxia or anomia. Speech is clear with normal prosody and enunciation. Thought process is linear. Mood is  normal and affect is normal.  Cranial nerves II - XII are as described above under HEENT exam.  Motor exam: Normal bulk, strength and tone is noted. There is no tremor. Fine motor skills and coordination: grossly intact.  Cerebellar testing: No dysmetria or intention tremor. There is no truncal or gait ataxia.  Sensory exam: intact to light touch in the upper and lower extremities.  Gait, station and balance: She stands easily. No veering to one side is noted. No leaning to one side is noted. Posture is age-appropriate and stance is narrow based. Gait shows normal stride length and normal pace. No problems turning are noted.   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 Sandria Manly, then Dr. Marjory Lies, and most recently through [facility] neurology, Dr. Trinda Pascal), whose history and physical exam are concerning for obstructive sleep apnea (OSA). I had a long chat with the patient about my findings and the diagnosis of OSA, its prognosis and treatment options. We talked about medical treatments, surgical interventions and non-pharmacological approaches. I explained in particular the risks and ramifications of untreated moderate to severe OSA, especially with respect to developing cardiovascular disease down the Road, including congestive heart failure, difficult to treat hypertension, cardiac arrhythmias, or stroke. Even type 2 diabetes has, in part, been linked to untreated OSA. Symptoms of untreated OSA include daytime sleepiness, memory problems, mood irritability and mood disorder such as depression and anxiety, lack of energy, as well as recurrent  headaches, especially morning headaches. We talked about trying to maintain a healthy lifestyle in general, as well as the importance of weight control. We also talked about the importance of good sleep hygiene. I recommended the following at this time: sleep study.  I outlined the differences between a laboratory attended sleep study versus home sleep test.  I explained the sleep test procedure to the patient and also outlined possible surgical and non-surgical treatment options of OSA, including the use of a custom-made  dental device (which would require a referral to a specialist dentist or oral surgeon), upper airway surgical options, such as traditional UPPP or a novel less invasive surgical option in the form of Inspire hypoglossal nerve stimulation (which would involve a referral to an ENT surgeon). I also explained the CPAP treatment option to the patient, who indicated that she would be willing to try CPAP if the need arises. I explained the importance of being compliant with PAP treatment, not only for insurance purposes but primarily to improve Her symptoms, and for the patient's long term health benefit, including to reduce Her cardiovascular risks. I answered all her questions today and the patient was in agreement. I plan to see her back after the sleep study is completed and encouraged her to call with any interim questions, concerns, problems or updates.   Thank you very much for allowing me to participate in the care of this nice patient. If I can be of any further assistance to you please do not hesitate to call me at [phone].  Sincerely,   Star Age, MD, PhD

## 2021-12-24 NOTE — Patient Instructions (Addendum)
Thank you for choosing [facility] for your sleep related care! It was nice to meet you today! I appreciate that you entrust me with your sleep related healthcare concerns. I hope, I was able to address at least some of your concerns today, and that I can help you feel reassured and also get better.    Here is what we discussed today and what we came up with as our plan for you:   Have a slower heart rate called bradycardia but this is not a new finding.  I did hear murmur, please discuss with Dr. Osborne Casco if there is any need for a checkup through his office or evaluation through cardiology.   Based on your symptoms and your exam I believe you are at risk for obstructive sleep apnea (aka OSA), and I think we should proceed with a sleep study to determine whether you do or do not have OSA and how severe it is. Even, if you have mild OSA, I may want you to consider treatment with CPAP, as treatment of even borderline or mild sleep apnea can result and improvement of symptoms such as sleep disruption, daytime sleepiness, nighttime bathroom breaks, restless leg symptoms, improvement of headache syndromes, even improved mood disorder.   As explained, an attended sleep study meaning you get to stay overnight in the sleep lab, lets [place] monitor sleep-related behaviors such as sleep talking and leg movements in sleep, in addition to monitoring for sleep apnea.  A home sleep test is a screening tool for sleep apnea only, and unfortunately does not help with any other sleep-related diagnoses.  Please remember, the long-term risks and ramifications of untreated moderate to severe obstructive sleep apnea are: increased Cardiovascular disease, including congestive heart failure, stroke, difficult to control hypertension, treatment resistant obesity, arrhythmias, especially irregular heartbeat commonly known as A. Fib. (atrial fibrillation); even type 2 diabetes has been linked to untreated OSA.    Sleep apnea can cause disruption of sleep and sleep deprivation in most cases, which, in turn, can cause recurrent headaches, problems with memory, mood, concentration, focus, and vigilance. Most people with untreated sleep apnea report excessive daytime sleepiness, which can affect their ability to drive. Please do not drive if you feel sleepy. Patients with sleep apnea can also develop difficulty initiating and maintaining sleep (aka insomnia).   Having sleep apnea may increase your risk for other sleep disorders, including involuntary behaviors sleep such as sleep terrors, sleep talking, sleepwalking.    Having sleep apnea can also increase your risk for restless leg syndrome and leg movements at night.   Please note that untreated obstructive sleep apnea may carry additional perioperative morbidity. Patients with significant obstructive sleep apnea (typically, in the moderate to severe degree) should receive, if possible, perioperative PAP (positive airway pressure) therapy and the surgeons and particularly the anesthesiologists should be informed of the diagnosis and the severity of the sleep disordered breathing.   I will likely see you back after your sleep study to go over the test results and where to go from there. We will call you after your sleep study to advise about the results (most likely, you will hear from [facility], my nurse) and to set up an appointment at the time, as necessary.    Our sleep lab administrative assistant will call you to schedule your sleep study and give you further instructions, regarding the check in process for the sleep study, arrival time, what to bring, when you can expect to leave after  the study, etc., and to answer any other logistical questions you may have. If you don't hear back from her by about 2 weeks from now, please feel free to call her direct line at [phone] or you can call our general clinic number, or email [place] through My Chart.

## 2021-12-31 ENCOUNTER — Telehealth: Payer: Self-pay | Admitting: Neurology

## 2021-12-31 NOTE — Telephone Encounter (Signed)
Tried to leave a VM for pt to schedule her sleep study but VM is full

## 2022-01-01 ENCOUNTER — Encounter: Payer: Self-pay | Admitting: Neurology

## 2022-01-04 ENCOUNTER — Telehealth: Payer: Self-pay | Admitting: Neurology

## 2022-01-04 NOTE — Telephone Encounter (Signed)
LVM for pt to call me back to schedule sleep study  

## 2022-02-16 ENCOUNTER — Ambulatory Visit (INDEPENDENT_AMBULATORY_CARE_PROVIDER_SITE_OTHER): Payer: Medicare Other | Admitting: Neurology

## 2022-02-16 DIAGNOSIS — G4719 Other hypersomnia: Secondary | ICD-10-CM

## 2022-02-16 DIAGNOSIS — G4761 Periodic limb movement disorder: Secondary | ICD-10-CM

## 2022-02-16 DIAGNOSIS — G4720 Circadian rhythm sleep disorder, unspecified type: Secondary | ICD-10-CM

## 2022-02-16 DIAGNOSIS — G2581 Restless legs syndrome: Secondary | ICD-10-CM

## 2022-02-16 DIAGNOSIS — R0681 Apnea, not elsewhere classified: Secondary | ICD-10-CM

## 2022-02-16 DIAGNOSIS — R351 Nocturia: Secondary | ICD-10-CM

## 2022-02-16 DIAGNOSIS — R0683 Snoring: Secondary | ICD-10-CM

## 2022-02-19 NOTE — Procedures (Signed)
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 The patient endorsed the Epworth Sleepiness Scale at 8 points.  The patient's weight 146 pounds with a height of 63 (inches), resulting in a BMI of 25.8 kg/m2. The patient's neck circumference measured 14 inches.  CURRENT MEDICATIONS: Fosamax, Norvasc, Calcium-Vitamin D3, Voltaren, Diclofenac, Olmesartan Medoxomil-HCTZ, Paxil, Mirapex, Desyrel, Celebrex   PROCEDURE:  This is a multichannel digital polysomnogram utilizing the Somnostar 11.2 system.  Electrodes and sensors were applied and monitored per AASM Specifications.   EEG, EOG, Chin and Limb EMG, were sampled at 200 Hz.  ECG, Snore and Nasal Pressure, Thermal Airflow, Respiratory Effort, CPAP Flow and Pressure, Oximetry was sampled at 50 Hz. Digital video and audio were recorded.      BASELINE STUDY  Patient took trazodone prior to lights out. Lights Out was at 20:30 and Lights On at 05:00.  Total recording time (TRT) was 510 minutes, with a total sleep time (TST) of 382 minutes.   The patient's sleep latency was 99 minutes.  REM sleep was absent. The sleep efficiency was 74.9 %.     SLEEP ARCHITECTURE: WASO (Wake after sleep onset) was 92 minutes with mild to moderate sleep fragmentation noted. There were 3 minutes in Stage N1, 294 minutes Stage N2, 85 minutes Stage N3 and 0 minutes in Stage REM.  The percentage of Stage N1 was .8%, Stage N2 was 77.%, which is increased, Stage N3 was 22.3% and Stage R (REM  sleep) was absent. The arousals were noted as: 90 were spontaneous, 7 were associated with PLMs, 12 were associated with respiratory events.  RESPIRATORY ANALYSIS:  There were a total of 16 respiratory events:  10 obstructive apneas, 0 central apneas and 0 mixed apneas with a total of 10 apneas and an apnea index (AI) of 1.6 /hour. There were 6 hypopneas with a hypopnea index of .9 /hour. The patient also had 0 respiratory event related arousals (RERAs).      The total APNEA/HYPOPNEA INDEX (AHI) was 2.5/hour and the total RESPIRATORY DISTURBANCE INDEX was  2.5 /hour.  0 events occurred in REM sleep and 12 events in NREM. The REM AHI was  n/a, versus a non-REM AHI of 2.5. The patient spent 70 minutes of total sleep time in the supine position and 312 minutes in non-supine.. The supine AHI was 6.8 versus a non-supine AHI of 1.6.  OXYGEN SATURATION & C02:  The Wake baseline 02 saturation was 96%, with the lowest being 85%. Time spent below 89% saturation equaled 29 minutes, which is overestimated due to sensor being dislodged. PERIODIC LIMB MOVEMENTS:   The patient had a total of 26 Periodic Limb Movements.  The Periodic Limb Movement (PLM) index was 4.1 and the PLM Arousal index was 1.1/hour.  Audio and video analysis did not show any abnormal or unusual movements, behaviors, phonations or vocalizations. She ate a snack in the early part of the study. The patient took 1 bathroom break. Intermittent mild to moderate snoring was noted. EKG was in keeping with  normal sinus rhythm (NSR).  Post-study, the patient indicated that sleep was similar to her usual sleep.   IMPRESSION:  Primary Snoring Dysfunctions associated with sleep stages or arousal from sleep  RECOMMENDATIONS:  This study does not demonstrate any significant obstructive or central sleep disordered breathing with the exception of intermittent mild to moderate snoring. The study was limited due to absence of REM sleep.  This study shows  sleep fragmentation and abnormal sleep stage percentages; these are nonspecific findings and per se do not signify an intrinsic sleep disorder or a cause for the patient's sleep-related symptoms. Causes include (but are not limited to) the first night effect of the sleep study, circadian rhythm disturbances, medication effect or an underlying mood disorder or medical problem.  The patient should be cautioned not to drive, work at heights, or operate dangerous or heavy equipment when tired or sleepy. Review and reiteration of good sleep hygiene measures should be pursued with any patient. The patient will be advised to follow up with the referring provider, who will be notified of the test results.  I certify that I have reviewed the entire raw data recording prior to the issuance of this report in accordance with the Standards of Accreditation of the American Academy of Sleep Medicine (AASM)  Huston Foley, MD, PhD Diplomat, American Board of Neurology and Sleep Medicine (Neurology and Sleep Medicine)

## 2022-12-06 ENCOUNTER — Encounter: Payer: Self-pay | Admitting: Internal Medicine

## 2022-12-06 DIAGNOSIS — Z1231 Encounter for screening mammogram for malignant neoplasm of breast: Secondary | ICD-10-CM

## 2022-12-08 ENCOUNTER — Other Ambulatory Visit: Payer: Self-pay | Admitting: Internal Medicine

## 2022-12-08 DIAGNOSIS — N644 Mastodynia: Secondary | ICD-10-CM

## 2023-12-12 ENCOUNTER — Other Ambulatory Visit: Payer: Self-pay | Admitting: Internal Medicine

## 2023-12-12 DIAGNOSIS — Z0000 Encounter for general adult medical examination without abnormal findings: Secondary | ICD-10-CM

## 2023-12-30 ENCOUNTER — Ambulatory Visit: Payer: Medicare Other

## 2024-01-18 ENCOUNTER — Encounter: Payer: Self-pay | Admitting: Cardiovascular Disease

## 2024-01-18 ENCOUNTER — Ambulatory Visit: Payer: Medicare Other | Attending: Cardiovascular Disease | Admitting: Cardiovascular Disease

## 2024-01-18 VITALS — BP 160/70 | HR 46 | Ht 63.0 in | Wt 143.8 lb

## 2024-01-18 DIAGNOSIS — R0609 Other forms of dyspnea: Secondary | ICD-10-CM | POA: Diagnosis not present

## 2024-01-18 DIAGNOSIS — E782 Mixed hyperlipidemia: Secondary | ICD-10-CM | POA: Diagnosis not present

## 2024-01-18 DIAGNOSIS — I10 Essential (primary) hypertension: Secondary | ICD-10-CM | POA: Insufficient documentation

## 2024-01-18 DIAGNOSIS — E785 Hyperlipidemia, unspecified: Secondary | ICD-10-CM | POA: Insufficient documentation

## 2024-01-18 DIAGNOSIS — Z8249 Family history of ischemic heart disease and other diseases of the circulatory system: Secondary | ICD-10-CM | POA: Diagnosis not present

## 2024-01-18 NOTE — Assessment & Plan Note (Signed)
History of essential hypertension her blood pressure measured today at 160/70.  She is on amlodipine and olmesartan.  Her blood pressure usually at home runs 138/70.

## 2024-01-18 NOTE — Assessment & Plan Note (Signed)
History of dyspnea on exertion over the last 6 months for unclear reasons.  There is no history of tobacco abuse.  I am going get a 2D echo and a coronary calcium score to further evaluate.

## 2024-01-18 NOTE — Progress Notes (Signed)
01/18/2024 Charlene Barnes   Jul 21, 1946  [id number]  Primary Physician Tisovec, Adelfa Koh, MD Primary Cardiologist: Runell Gess MD Nicholes Calamity, [place]  HPI:  Charlene Barnes is a 78 y.o. 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  She was referred by Dr. Wylene Simmer at [facility] medical for evaluation of dyspnea on exertion.  Her risk factors are positive for treated hypertension, untreated mild hyperlipidemia.  There is no family history of heart disease.  She is never had a heart attack or stroke.  She denies chest pain but over the last 6 months has noticed some increasing dyspnea on exertion for unclear reasons.   Current Meds  Medication Sig   alendronate (FOSAMAX) 70 MG tablet TAKE 1 TABLET BY MOUTH  WEEKLY WITH 8 OZ OF PLAIN  WATER 30 MINUTES BEFORE  FIRST FOOD, DRINK OR MEDS.  STAY UPRIGHT FOR 30 MINS   amLODipine (NORVASC) 10 MG tablet Take 1 tablet by mouth daily.   Calcium Carbonate-Vitamin D (CALCIUM-VITAMIN D3) 600-3.125 MG-MCG TABS Take 1 tablet po qd (Calcium 1200mg / Vitamin D 1000iu)   diclofenac (VOLTAREN) 75 MG EC tablet    olmesartan (BENICAR) 20 MG tablet Take 20 mg by mouth daily.   PARoxetine (PAXIL) 20 MG tablet Take 20 mg by mouth daily.   pramipexole (MIRAPEX) 1.5 MG tablet Take 1.5 mg by mouth daily.   traZODone (DESYREL) 100 MG tablet Take 100 mg by mouth at bedtime.     Allergies  Allergen Reactions   Adhesive [Tape]    Latex Rash   Wound Dressing Adhesive Rash    Social History   Socioeconomic History   Marital status: Married    Spouse name: Adriana Simas   Number of children: 2   Years of education: College   Highest education level: Not on file  Occupational History   Occupation: retired  Tobacco Use   Smoking status: Never   Smokeless tobacco: Not on file  Vaping Use   Vaping status: Never Used   Substance and Sexual Activity   Alcohol use: Yes    Comment: occasionally   Drug use: Not Currently    Types: Marijuana    Comment: on occasion   Sexual activity: Not on file  Other Topics Concern   Not on file  Social History Narrative   Patient lives at home with spouse.Caffeine Use: 1-2 cups daily. Working retired 2013.    Social Drivers of Corporate investment banker Strain: Low Risk  (05/25/2022)   Received from [facility], [facility]   Overall Financial Resource Strain (CARDIA)    Difficulty of Paying Living Expenses: Not hard at all  Food Insecurity: No Food Insecurity (05/25/2022)   Received from [facility], [facility]   Hunger Vital Sign    Worried About Running Out of Food in the Last Year: Never true    Ran Out of Food in the Last Year: Never true  Transportation Needs: Not on file  Physical Activity: Insufficiently Active (05/25/2022)   Received from [facility], [facility]   Exercise Vital Sign    Days of Exercise per Week: 2 days    Minutes of Exercise per Session: 60 min  Stress: No Stress Concern Present (05/25/2022)   Received from [facility], [facility] of Occupational Health - Occupational Stress Questionnaire    Feeling  of Stress : Only a little  Social Connections: Socially Integrated (05/25/2022)   Received from [facility], [facility]   Social Network    How would you rate your social network (family, work, friends)?: Good participation with social networks  Intimate Partner Violence: Not At Risk (05/25/2022)   Received from [facility], [facility]   HITS    Over the last 12 months how often did your partner physically hurt you?: Never    Over the last 12 months how often did your partner insult you or talk down to you?: Rarely    Over the last 12 months how often did your partner threaten you with physical harm?: Never    Over the last 12 months how often did your partner scream or curse at you?: Never      Review of Systems: General: negative for chills, fever, night sweats or weight changes.  Cardiovascular: negative for chest pain, dyspnea on exertion, edema, orthopnea, palpitations, paroxysmal nocturnal dyspnea or shortness of breath Dermatological: negative for rash Respiratory: negative for cough or wheezing Urologic: negative for hematuria Abdominal: negative for nausea, vomiting, diarrhea, bright red blood per rectum, melena, or hematemesis Neurologic: negative for visual changes, syncope, or dizziness All other systems reviewed and are otherwise negative except as noted above.    Blood pressure (!) 160/70, pulse (!) 46, height 5\' 3"  (1.6 m), weight 143 lb 12.8 oz (65.2 kg), SpO2 98%.  General appearance: alert and no distress Neck: no adenopathy, no carotid bruit, no JVD, supple, symmetrical, trachea midline, and thyroid not enlarged, symmetric, no tenderness/mass/nodules Lungs: clear to auscultation bilaterally Heart: regular rate and rhythm, S1, S2 normal, no murmur, click, rub or gallop Extremities: extremities normal, atraumatic, no cyanosis or edema Pulses: 2+ and symmetric Skin: Skin color, texture, turgor normal. No rashes or lesions Neurologic: Grossly normal  EKG EKG Interpretation Date/Time:  Wednesday January 18 2024 11:25:12 EST Ventricular Rate:  46 PR Interval:  162 QRS Duration:  104 QT Interval:  486 QTC Calculation: 425 R Axis:   -53  Text Interpretation: Sinus bradycardia Left anterior fascicular block Minimal voltage criteria for LVH, may be normal variant ( Cornell product ) Septal infarct , age undetermined Possible Lateral infarct , age undetermined When compared with ECG of 20-Jan-2011 08:07, Borderline criteria for Lateral infarct are now Present Confirmed by Nanetta Batty [phone]) on 01/18/2024 11:28:54 AM    ASSESSMENT AND PLAN:   Essential hypertension History of essential hypertension her blood pressure measured today at 160/70.  She is on  amlodipine and olmesartan.  Her blood pressure usually at home runs 138/70.  Dyspnea on exertion History of dyspnea on exertion over the last 6 months for unclear reasons.  There is no history of tobacco abuse.  I am going get a 2D echo and a coronary calcium score to further evaluate.  Hyperlipidemia History of mild hyperlipidemia not on statin therapy with lipid profile performed by her PCP 11//24 revealing total cholesterol 216, LDL 133 and HDL 71.  We will get a coronary calcium score to her stratify.     Runell Gess MD FACP,FACC,FAHA, [facility] 01/18/2024 11:41 AM

## 2024-01-18 NOTE — Patient Instructions (Signed)
    Testing/Procedures:  CORONARY CALCIUM SCORING CT AT THE [facility]  Your physician has requested that you have an echocardiogram. Echocardiography is a painless test that uses sound waves to create images of your heart. It provides your doctor with information about the size and shape of your heart and how well your heart's chambers and valves are working. This procedure takes approximately one hour. There are no restrictions for this procedure. Please do NOT wear cologne, perfume, aftershave, or lotions (deodorant is allowed). Please arrive 15 minutes prior to your appointment time.  Please note: We ask at that you not bring children with you during ultrasound (echo/ vascular) testing. Due to room size and safety concerns, children are not allowed in the ultrasound rooms during exams. Our front office staff cannot provide observation of children in our lobby area while testing is being conducted. An adult accompanying a patient to their appointment will only be allowed in the ultrasound room at the discretion of the ultrasound technician under special circumstances. We apologize for any inconvenience. [facility]   Follow-Up: At [facility], you and your health needs are our priority.  As part of our continuing mission to provide you with exceptional heart care, we have created designated Provider Care Teams.  These Care Teams include your primary Cardiologist (physician) and Advanced Practice Providers (APPs -  Physician Assistants and Nurse Practitioners) who all work together to provide you with the care you need, when you need it.  We recommend signing up for the patient portal called "MyChart".  Sign up information is provided on this After Visit Summary.  MyChart is used to connect with patients for Virtual Visits (Telemedicine).  Patients are able to view lab/test results, encounter notes, upcoming appointments, etc.  Non-urgent messages can be sent to your  provider as well.   To learn more about what you can do with MyChart, go to [email or web address].    Your next appointment:   3 month(s)  Provider:   Nanetta Batty MD

## 2024-01-18 NOTE — Assessment & Plan Note (Signed)
History of mild hyperlipidemia not on statin therapy with lipid profile performed by her PCP 11//24 revealing total cholesterol 216, LDL 133 and HDL 71.  We will get a coronary calcium score to her stratify.

## 2024-02-29 ENCOUNTER — Ambulatory Visit (HOSPITAL_BASED_OUTPATIENT_CLINIC_OR_DEPARTMENT_OTHER): Payer: Medicare Other

## 2024-02-29 DIAGNOSIS — R0609 Other forms of dyspnea: Secondary | ICD-10-CM

## 2024-02-29 LAB — ECHOCARDIOGRAM COMPLETE
AV Vena cont: 0.38 cm
Area-P 1/2: 3.42 cm2
P 1/2 time: 1108 ms
S' Lateral: 3.28 cm

## 2024-03-12 ENCOUNTER — Ambulatory Visit (INDEPENDENT_AMBULATORY_CARE_PROVIDER_SITE_OTHER): Payer: Self-pay

## 2024-03-12 DIAGNOSIS — R0609 Other forms of dyspnea: Secondary | ICD-10-CM

## 2024-03-20 ENCOUNTER — Ambulatory Visit: Attending: Cardiovascular Disease | Admitting: Cardiovascular Disease

## 2024-03-20 ENCOUNTER — Encounter: Payer: Self-pay | Admitting: Cardiovascular Disease

## 2024-03-20 VITALS — BP 122/66 | HR 59 | Ht 63.5 in | Wt 144.2 lb

## 2024-03-20 DIAGNOSIS — R0609 Other forms of dyspnea: Secondary | ICD-10-CM

## 2024-03-20 DIAGNOSIS — I10 Essential (primary) hypertension: Secondary | ICD-10-CM

## 2024-03-20 DIAGNOSIS — I351 Nonrheumatic aortic (valve) insufficiency: Secondary | ICD-10-CM | POA: Diagnosis not present

## 2024-03-20 NOTE — H&P (View-Only) (Signed)
 03/20/2024 Charlene Barnes   [birth year]  [id number]  Primary Physician Tisovec, Adelfa Koh, MD Primary Cardiologist: Runell Gess MD Nicholes Calamity, [place]  HPI:  Charlene Barnes is a 78 y.o.   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  She was referred by Dr. Wylene Simmer at [facility] medical for evaluation of dyspnea on exertion.  I last saw her in the office 01/18/2024.  Her risk factors are positive for treated hypertension, untreated mild hyperlipidemia.  There is no family history of heart disease.  She is never had a heart attack or stroke.  She denies chest pain but over the last 6 months has noticed some increasing dyspnea on exertion for unclear reasons.  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  Based on this, I decided to proceed with outpatient TEE followed by right and left heart cath to define her anatomy and physiology in anticipation of potential aortic valve replacement.   Current Meds  Medication Sig   alendronate (FOSAMAX) 70 MG tablet TAKE 1 TABLET BY MOUTH  WEEKLY WITH 8 OZ OF PLAIN  WATER 30 MINUTES BEFORE  FIRST FOOD, DRINK OR MEDS.  STAY UPRIGHT FOR 30 MINS   amLODipine (NORVASC) 10 MG tablet Take 1 tablet by mouth daily.   OLMESARTAN MEDOXOMIL-HCTZ PO    PARoxetine (PAXIL) 20 MG tablet Take 20 mg by mouth daily.   pramipexole (MIRAPEX) 1.5 MG tablet Take 1.5 mg by mouth daily.   [DISCONTINUED] diclofenac (VOLTAREN) 75 MG EC tablet      Allergies  Allergen Reactions   Adhesive [Tape]    Latex Rash   Wound Dressing Adhesive Rash    Social History   Socioeconomic History   Marital status: Married    Spouse name: Charlene Barnes   Number of children: 2   Years of education: College    Highest education level: Not on file  Occupational History   Occupation: retired  Tobacco Use   Smoking status: Never   Smokeless tobacco: Not on file  Vaping Use   Vaping status: Never Used  Substance and Sexual Activity   Alcohol use: Yes    Comment: occasionally   Drug use: Not Currently    Types: Marijuana    Comment: on occasion   Sexual activity: Not on file  Other Topics Concern   Not on file  Social History Narrative   Patient lives at home with spouse.Caffeine Use: 1-2 cups daily. Working retired 2013.    Social Drivers of Corporate investment banker Strain: Low Risk  (05/25/2022)   Received from [facility], [facility]   Overall Financial Resource Strain (CARDIA)    Difficulty of Paying Living Expenses: Not hard at all  Food Insecurity: No Food Insecurity (05/25/2022)   Received from [facility], [facility]   Hunger Vital Sign    Worried About Running Out of Food in the Last Year: Never true    Ran Out of Food in the Last Year: Never true  Transportation Needs: Not on file  Physical Activity: Insufficiently Active (05/25/2022)   Received from [facility], [facility]   Exercise Vital Sign    Days of Exercise per Week: 2 days    Minutes  of Exercise per Session: 60 min  Stress: No Stress Concern Present (05/25/2022)   Received from [facility], [facility] of Occupational Health - Occupational Stress Questionnaire    Feeling of Stress : Only a little  Social Connections: Socially Integrated (05/25/2022)   Received from [facility], [facility]   Social Network    How would you rate your social network (family, work, friends)?: Good participation with social networks  Intimate Partner Violence: Not At Risk (05/25/2022)   Received from [facility], [facility]   HITS    Over the last 12 months how often did your partner physically hurt you?: Never    Over the last 12 months how often did your partner insult you or talk down to  you?: Rarely    Over the last 12 months how often did your partner threaten you with physical harm?: Never    Over the last 12 months how often did your partner scream or curse at you?: Never     Review of Systems: General: negative for chills, fever, night sweats or weight changes.  Cardiovascular: negative for chest pain, dyspnea on exertion, edema, orthopnea, palpitations, paroxysmal nocturnal dyspnea or shortness of breath Dermatological: negative for rash Respiratory: negative for cough or wheezing Urologic: negative for hematuria Abdominal: negative for nausea, vomiting, diarrhea, bright red blood per rectum, melena, or hematemesis Neurologic: negative for visual changes, syncope, or dizziness All other systems reviewed and are otherwise negative except as noted above.    Blood pressure 122/66, pulse (!) 59, height 5' 3.5" (1.613 m), weight 144 lb 3.2 oz (65.4 kg), SpO2 99%.  General appearance: alert and no distress Neck: no adenopathy, no carotid bruit, no JVD, supple, symmetrical, trachea midline, and thyroid not enlarged, symmetric, no tenderness/mass/nodules Lungs: clear to auscultation bilaterally Heart: regular rate and rhythm, S1, S2 normal, no murmur, click, rub or gallop Extremities: extremities normal, atraumatic, no cyanosis or edema Pulses: 2+ and symmetric Skin: Skin color, texture, turgor normal. No rashes or lesions Neurologic: Grossly normal  EKG  EKG Interpretation Date/Time:  Tuesday March 20 2024 15:48:11 EDT Ventricular Rate:  48 PR Interval:  158 QRS Duration:  102 QT Interval:  456 QTC Calculation: 407 R Axis:   52  Text Interpretation: Sinus bradycardia Septal infarct (cited on or before 18-Jan-2024) When compared with ECG of 18-Jan-2024 11:25, Left anterior fascicular block is no longer Present Confirmed by Nanetta Batty [phone]) on 03/20/2024 3:53:15 PM    ASSESSMENT AND PLAN:   Dyspnea on exertion Charlene Barnes  returns today for follow-up of her  outpatient diagnostic test.  Her coronary calcium score was 0.  Her echo however showed moderate to severe AI.  She is symptomatic.  Based on this I have elected to proceed with outpatient TEE followed by right left heart cath open (radial/brachial) by Dr. Swaziland who has met the patient in the office.     Runell Gess MD FACP,FACC,FAHA, [facility] 03/20/2024 3:53 PM

## 2024-03-20 NOTE — Assessment & Plan Note (Signed)
 Charlene Barnes  returns today for follow-up of her outpatient diagnostic test.  Her coronary calcium score was 0.  Her echo however showed moderate to severe AI.  She is symptomatic.  Based on this I have elected to proceed with outpatient TEE followed by right left heart cath open (radial/brachial) by Dr. Swaziland who has met the patient in the office.

## 2024-03-20 NOTE — Patient Instructions (Signed)
 Medication Instructions:  Your physician recommends that you continue on your current medications as directed. Please refer to the Current Medication list given to you today.  *If you need a refill on your cardiac medications before your next appointment, please call your pharmacy*  Lab Work: Your physician recommends that you labs drawn today: BMET & CBC   If you have labs (blood work) drawn today and your tests are completely normal, you will receive your results only by: MyChart Message (if you have MyChart) OR A paper copy in the mail If you have any lab test that is abnormal or we need to change your treatment, we will call you to review the results.  Testing/Procedures: See below  Follow-Up: At [facility], you and your health needs are our priority.  As part of our continuing mission to provide you with exceptional heart care, our providers are all part of one team.  This team includes your primary Cardiologist (physician) and Advanced Practice Providers or APPs (Physician Assistants and Nurse Practitioners) who all work together to provide you with the care you need, when you need it.  Your next appointment:   2-3 week(s) after your procedure (4/18)  Provider:   Nanetta Batty, MD    We recommend signing up for the patient portal called "MyChart".  Sign up information is provided on this After Visit Summary.  MyChart is used to connect with patients for Virtual Visits (Telemedicine).  Patients are able to view lab/test results, encounter notes, upcoming appointments, etc.  Non-urgent messages can be sent to your provider as well.   To learn more about what you can do with MyChart, go to [email or web address].   Other Instructions    You are scheduled for a TEE (Transesophageal Echocardiogram) on Friday, April 18 with Dr. Jacques Navy.  Please arrive at the [facility] (Main Entrance A) at [facility]: [address] [place], [place] [address] at 7:00 AM (This  time is 1 hour(s) before your procedure to ensure your preparation).   Free valet parking service is available. You will check in at ADMITTING.   *Please Note: You will receive a call the day before your procedure to confirm the appointment time. That time may have changed from the original time based on the schedule for that day.*    DIET:  Nothing to eat or drink after midnight except a sip of water with medications (see medication instructions below)  MEDICATION INSTRUCTIONS: !!IF ANY NEW MEDICATIONS ARE STARTED AFTER TODAY, PLEASE NOTIFY YOUR PROVIDER AS SOON AS POSSIBLE!!  FYI: Medications such as Semaglutide (Ozempic, [place]), Tirzepatide (Mounjaro, Zepbound), Dulaglutide (Trulicity), etc ("GLP1 agonists") AND Canagliflozin (Invokana), Dapagliflozin (Farxiga), Empagliflozin (Jardiance), Ertugliflozin (Steglatro), Bexagliflozin [employer]) or any combination with one of these drugs such as Invokamet (Canagliflozin/Metformin), Synjardy (Empagliflozin/Metformin), etc ("SGLT2 inhibitors") must be held around the time of a procedure. This is not a comprehensive list of all of these drugs. Please review all of your medications and talk to your provider if you take any one of these. If you are not sure, ask your provider.  { LABS: Labs drawn on 4/1   FYI:  For your safety, and to allow [place] to monitor your vital signs accurately during the surgery/procedure we request: If you have artificial nails, gel coating, SNS etc, please have those removed prior to your surgery/procedure. Not having the nail coverings /polish removed may result in cancellation or delay of your surgery/procedure.  Your support person will be asked to wait in the  waiting room during your procedure.  It is OK to have someone drop you off and come back when you are ready to be discharged.  You cannot drive after the procedure and will need someone to drive you home.  Bring your insurance cards.  *Special Note: Every effort is  made to have your procedure done on time. Occasionally there are emergencies that occur at the hospital that may cause delays. Please be patient if a delay does occur.            Cardiac/Peripheral Catheterization   You are scheduled for a Cardiac Catheterization on Friday, April 18 with Dr. Peter Swaziland.  1. Please arrive at the [facility] (Main Entrance A) at [facility]: [address] [place], [place] [address] at 7:00 AM (This time is 1 hour(s) before your procedure to ensure your preparation).   Free valet parking service is available. You will check in at ADMITTING. The support person will be asked to wait in the waiting room.  It is OK to have someone drop you off and come back when you are ready to be discharged.        Special note: Every effort is made to have your procedure done on time. Please understand that emergencies sometimes delay scheduled procedures.  2. Diet: Do not eat solid foods after midnight.  You may have clear liquids until 5 AM the day of the procedure.  3. Labs: You will need to have blood drawn today (4/1).  4. Medication instructions in preparation for your procedure:    On the morning of your procedure, take Aspirin 81 mg and any morning medicines NOT listed above.  You may use sips of water.  5. Plan to go home the same day, you will only stay overnight if medically necessary. 6. You MUST have a responsible adult to drive you home. 7. An adult MUST be with you the first 24 hours after you arrive home. 8. Bring a current list of your medications, and the last time and date medication taken. 9. Bring ID and current insurance cards. 10.Please wear clothes that are easy to get on and off and wear slip-on shoes.  Thank you for allowing [place] to care for you!   -- [facility]          1st Floor: - Lobby - Registration  - Pharmacy  - Lab - Cafe  2nd Floor: - PV Lab - Diagnostic Testing (echo, CT,  nuclear med)  3rd Floor: - Vacant  4th Floor: - TCTS (cardiothoracic surgery) - AFib Clinic - Structural Heart Clinic - Vascular Surgery  - Vascular Ultrasound  5th Floor: - HeartCare Cardiology (general and EP) - Clinical Pharmacy for coumadin, hypertension, lipid, weight-loss medications, and med management appointments    Valet parking services will be available as well.

## 2024-03-20 NOTE — Progress Notes (Unsigned)
 03/20/2024 Charlene Barnes   [birth year]  [id number]  Primary Physician Tisovec, Charlene Koh, MD Primary Cardiologist: Charlene Gess MD Charlene Barnes, Charlene Barnes  HPI:  Charlene Barnes is a 78 y.o.   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  She was referred by Dr. Wylene [facility] at [facility] for evaluation of dyspnea on exertion.  I last saw her in the office 01/18/2024.  Her risk factors are positive for treated hypertension, untreated mild hyperlipidemia.  There is no family history of heart disease.  She is never had a heart attack or stroke.  She denies chest pain but over the last 6 months has noticed some increasing dyspnea on exertion for unclear reasons.  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  Based on this, I decided to proceed with outpatient TEE followed by right and left heart cath to define her anatomy and physiology in anticipation of potential aortic valve replacement.   Current Meds  Medication Sig   alendronate (FOSAMAX) 70 MG tablet TAKE 1 TABLET BY MOUTH  WEEKLY WITH 8 OZ OF PLAIN  WATER 30 MINUTES BEFORE  FIRST FOOD, DRINK OR MEDS.  STAY UPRIGHT FOR 30 MINS   amLODipine (NORVASC) 10 MG tablet Take 1 tablet by mouth daily.   OLMESARTAN MEDOXOMIL-HCTZ PO    PARoxetine (PAXIL) 20 MG tablet Take 20 mg by mouth daily.   pramipexole (MIRAPEX) 1.5 MG tablet Take 1.5 mg by mouth daily.   [DISCONTINUED] diclofenac (VOLTAREN) 75 MG EC tablet      Allergies  Allergen Reactions   Adhesive [Tape]    Latex Rash   Wound Dressing Adhesive Rash    Social History   Socioeconomic History   Marital status: Married    Spouse name: Charlene Barnes   Number of children: 2   Years of education: College    Highest education level: Not on file  Occupational History   Occupation: retired  Tobacco Use   Smoking status: Never   Smokeless tobacco: Not on file  Vaping Use   Vaping status: Never Used  Substance and Sexual Activity   Alcohol use: Yes    Comment: occasionally   Drug use: Not Currently    Types: Marijuana    Comment: on occasion   Sexual activity: Not on file  Other Topics Concern   Not on file  Social History Narrative   Patient lives at home with spouse.Caffeine Use: 1-2 cups daily. Working retired 2013.    Social Drivers of Corporate investment banker Strain: Low Risk  (05/25/2022)   Received from [facility], [facility]   Overall Financial Resource Strain (CARDIA)    Difficulty of Paying Living Expenses: Not hard at all  Food Insecurity: No Food Insecurity (05/25/2022)   Received from [facility], [facility]   Hunger Vital Sign    Worried About Running Out of Food in the Last Year: Never true    Ran Out of Food in the Last Year: Never true  Transportation Needs: Not on file  Physical Activity: Insufficiently Active (05/25/2022)   Received from [facility], [facility]   Exercise Vital Sign    Days of Exercise per Week: 2 days    Minutes  of Exercise per Session: 60 min  Stress: No Stress Concern Present (05/25/2022)   Received from [facility], [place] [facility] - Occupational Stress Questionnaire    Feeling of Stress : Only a little  Social Connections: Socially Integrated (05/25/2022)   Received from [facility]   Social Network    How would you rate your social network (family, work, friends)?: Good participation with social networks  Intimate Partner Violence: Not At Risk (05/25/2022)   Received from [facility], [facility]   HITS    Over the last 12 months how often did your partner physically hurt you?: Never    Over the last 12 months how often did your partner insult you or talk down to  you?: Rarely    Over the last 12 months how often did your partner threaten you with physical harm?: Never    Over the last 12 months how often did your partner scream or curse at you?: Never     Review of Systems: General: negative for chills, fever, night sweats or weight changes.  Cardiovascular: negative for chest pain, dyspnea on exertion, edema, orthopnea, palpitations, paroxysmal nocturnal dyspnea or shortness of breath Dermatological: negative for rash Respiratory: negative for cough or wheezing Urologic: negative for hematuria Abdominal: negative for nausea, vomiting, diarrhea, bright red blood per rectum, melena, or hematemesis Neurologic: negative for visual changes, syncope, or dizziness All other systems reviewed and are otherwise negative except as noted above.    Blood pressure 122/66, pulse (!) 59, height 5' 3.5" (1.613 m), weight 144 lb 3.2 oz (65.4 kg), SpO2 99%.  General appearance: alert and no distress Neck: no adenopathy, no carotid bruit, no JVD, supple, symmetrical, trachea midline, and thyroid not enlarged, symmetric, no tenderness/mass/nodules Lungs: clear to auscultation bilaterally Heart: regular rate and rhythm, S1, S2 normal, no murmur, click, rub or gallop Extremities: extremities normal, atraumatic, no cyanosis or edema Pulses: 2+ and symmetric Skin: Skin color, texture, turgor normal. No rashes or lesions Neurologic: Grossly normal  EKG  EKG Interpretation Date/Time:  Tuesday March 20 2024 15:48:11 EDT Ventricular Rate:  48 PR Interval:  158 QRS Duration:  102 QT Interval:  456 QTC Calculation: 407 R Axis:   52  Text Interpretation: Sinus bradycardia Septal infarct (cited on or before 18-Jan-2024) When compared with ECG of 18-Jan-2024 11:25, Left anterior fascicular block is no longer Present Confirmed by Charlene Barnes [phone]) on 03/20/2024 3:53:15 PM    ASSESSMENT AND PLAN:   Dyspnea on exertion Charlene Barnes  returns today for follow-up of her  outpatient diagnostic test.  Her coronary calcium score was 0.  Her echo however showed moderate to severe AI.  She is symptomatic.  Based on this I have elected to proceed with outpatient TEE followed by right left heart cath open (radial/brachial) by Dr. [place] who has met the patient in the office.     Charlene Gess MD FACP,FACC,FAHA, [place] [facility] 03/20/2024 3:53 PM

## 2024-03-21 LAB — CBC WITH DIFFERENTIAL/PLATELET
Basophils Absolute: 0 10*3/uL (ref 0.0–0.2)
Basos: 0 %
EOS (ABSOLUTE): 0.1 10*3/uL (ref 0.0–0.4)
Eos: 2 %
Hematocrit: 40.1 % (ref 34.0–46.6)
Hemoglobin: 13.4 g/dL (ref 11.1–15.9)
Immature Grans (Abs): 0 10*3/uL (ref 0.0–0.1)
Immature Granulocytes: 0 %
Lymphocytes Absolute: 2.5 10*3/uL (ref 0.7–3.1)
Lymphs: 44 %
MCH: 30 pg (ref 26.6–33.0)
MCHC: 33.4 g/dL (ref 31.5–35.7)
MCV: 90 fL (ref 79–97)
Monocytes Absolute: 0.6 10*3/uL (ref 0.1–0.9)
Monocytes: 10 %
Neutrophils Absolute: 2.4 10*3/uL (ref 1.4–7.0)
Neutrophils: 44 %
Platelets: 333 10*3/uL (ref 150–450)
RBC: 4.47 x10E6/uL (ref 3.77–5.28)
RDW: 14.2 % (ref 11.7–15.4)
WBC: 5.6 10*3/uL (ref 3.4–10.8)

## 2024-03-21 LAB — BASIC METABOLIC PANEL WITH GFR
BUN/Creatinine Ratio: 21 (ref 12–28)
BUN: 14 mg/dL (ref 8–27)
CO2: 24 mmol/L (ref 20–29)
Calcium: 9.7 mg/dL (ref 8.7–10.3)
Chloride: 103 mmol/L (ref 96–106)
Creatinine, Ser: 0.68 mg/dL (ref 0.57–1.00)
Glucose: 73 mg/dL (ref 70–99)
Potassium: 4.8 mmol/L (ref 3.5–5.2)
Sodium: 141 mmol/L (ref 134–144)
eGFR: 91 mL/min/{1.73_m2} (ref >59)

## 2024-04-04 ENCOUNTER — Telehealth: Payer: Self-pay | Admitting: *Deleted

## 2024-04-04 NOTE — Telephone Encounter (Addendum)
 Cardiac Catheterization scheduled at [facility] for: Friday April 06, 2024 9 AM/TEE 8 AM Arrival time [facility] Main Entrance A at: 7 AM  Nothing to eat or drink after midnight, may have sip of water for medications.  Medication instructions: -Usual morning medications can be taken with sips of water including aspirin 81 mg.  Plan to go home the same day, you will only stay overnight if medically necessary.  You must have responsible adult to drive you home.  Someone must be with you the first 24 hours after you arrive home.  Reviewed procedure instructions with patient.

## 2024-04-05 NOTE — Progress Notes (Signed)
 Left message for return call to review instructions for 04/06/24

## 2024-04-05 NOTE — Progress Notes (Signed)
 Spoke to patient and instructed them to come at 06:45  and to be NPO after 0000. Medications reviewed.    Confirmed that patient will have a ride home and someone to stay with them for 24 hours after the procedure.

## 2024-04-06 ENCOUNTER — Ambulatory Visit (HOSPITAL_COMMUNITY): Admitting: Anesthesiology

## 2024-04-06 ENCOUNTER — Ambulatory Visit (HOSPITAL_COMMUNITY)
Admission: RE | Admit: 2024-04-06 | Discharge: 2024-04-06 | Disposition: A | Discharge status: Home / Self Care | Attending: Internal Medicine | Admitting: Internal Medicine

## 2024-04-06 ENCOUNTER — Encounter (HOSPITAL_COMMUNITY)
Admission: RE | Disposition: A | Discharge status: Home / Self Care | Payer: Self-pay | Source: Home / Self Care | Attending: Internal Medicine

## 2024-04-06 ENCOUNTER — Ambulatory Visit (HOSPITAL_BASED_OUTPATIENT_CLINIC_OR_DEPARTMENT_OTHER)
Admission: RE | Admit: 2024-04-06 | Discharge: 2024-04-06 | Disposition: A | Discharge status: Home / Self Care | Source: Ambulatory Visit | Attending: Internal Medicine | Admitting: Internal Medicine

## 2024-04-06 ENCOUNTER — Other Ambulatory Visit: Payer: Self-pay

## 2024-04-06 DIAGNOSIS — Z79899 Other long term (current) drug therapy: Secondary | ICD-10-CM | POA: Diagnosis not present

## 2024-04-06 DIAGNOSIS — E785 Hyperlipidemia, unspecified: Secondary | ICD-10-CM

## 2024-04-06 DIAGNOSIS — I351 Nonrheumatic aortic (valve) insufficiency: Secondary | ICD-10-CM

## 2024-04-06 DIAGNOSIS — I10 Essential (primary) hypertension: Secondary | ICD-10-CM | POA: Insufficient documentation

## 2024-04-06 HISTORY — PX: TRANSESOPHAGEAL ECHOCARDIOGRAM (CATH LAB): EP1270

## 2024-04-06 HISTORY — PX: RIGHT/LEFT HEART CATH AND CORONARY ANGIOGRAPHY: CATH118266

## 2024-04-06 LAB — POCT I-STAT EG7
Acid-base deficit: 1 mmol/L (ref 0.0–2.0)
Acid-base deficit: 1 mmol/L (ref 0.0–2.0)
Bicarbonate: 24.5 mmol/L (ref 20.0–28.0)
Bicarbonate: 24.5 mmol/L (ref 20.0–28.0)
Calcium, Ion: 1.17 mmol/L (ref 1.15–1.40)
Calcium, Ion: 1.21 mmol/L (ref 1.15–1.40)
HCT: 35 % — ABNORMAL LOW (ref 36.0–46.0)
HCT: 35 % — ABNORMAL LOW (ref 36.0–46.0)
Hemoglobin: 11.9 g/dL — ABNORMAL LOW (ref 12.0–15.0)
Hemoglobin: 11.9 g/dL — ABNORMAL LOW (ref 12.0–15.0)
O2 Saturation: 70 %
O2 Saturation: 73 %
Potassium: 3.6 mmol/L (ref 3.5–5.1)
Potassium: 3.7 mmol/L (ref 3.5–5.1)
Sodium: 142 mmol/L (ref 135–145)
Sodium: 143 mmol/L (ref 135–145)
TCO2: 26 mmol/L (ref 22–32)
TCO2: 26 mmol/L (ref 22–32)
pCO2, Ven: 42.4 mmHg — ABNORMAL LOW (ref 44–60)
pCO2, Ven: 43 mmHg — ABNORMAL LOW (ref 44–60)
pH, Ven: 7.364 (ref 7.25–7.43)
pH, Ven: 7.369 (ref 7.25–7.43)
pO2, Ven: 38 mmHg (ref 32–45)
pO2, Ven: 40 mmHg (ref 32–45)

## 2024-04-06 LAB — ECHO TEE
AR max vel: 1.96 cm2
AV Area VTI: 1.92 cm2
AV Area mean vel: 1.72 cm2
AV Mean grad: 4 mmHg
AV Peak grad: 8.9 mmHg
Ao pk vel: 1.49 m/s

## 2024-04-06 LAB — POCT I-STAT 7, (LYTES, BLD GAS, ICA,H+H)
Acid-base deficit: 2 mmol/L (ref 0.0–2.0)
Bicarbonate: 23.2 mmol/L (ref 20.0–28.0)
Calcium, Ion: 1.15 mmol/L (ref 1.15–1.40)
HCT: 35 % — ABNORMAL LOW (ref 36.0–46.0)
Hemoglobin: 11.9 g/dL — ABNORMAL LOW (ref 12.0–15.0)
O2 Saturation: 92 %
Potassium: 3.6 mmol/L (ref 3.5–5.1)
Sodium: 143 mmol/L (ref 135–145)
TCO2: 24 mmol/L (ref 22–32)
pCO2 arterial: 38.7 mmHg (ref 32–48)
pH, Arterial: 7.386 (ref 7.35–7.45)
pO2, Arterial: 64 mmHg — ABNORMAL LOW (ref 83–108)

## 2024-04-06 SURGERY — TRANSESOPHAGEAL ECHOCARDIOGRAM (TEE) (CATHLAB)
Anesthesia: Monitor Anesthesia Care

## 2024-04-06 SURGERY — RIGHT/LEFT HEART CATH AND CORONARY ANGIOGRAPHY
Anesthesia: LOCAL

## 2024-04-06 MED ORDER — MIDAZOLAM HCL 2 MG/2ML IJ SOLN
INTRAMUSCULAR | Status: DC | PRN
  Administered 2024-04-06: 1 mg via INTRAVENOUS

## 2024-04-06 MED ORDER — LIDOCAINE HCL (PF) 1 % IJ SOLN
INTRAMUSCULAR | Status: AC
  Filled 2024-04-06: qty 30

## 2024-04-06 MED ORDER — VERAPAMIL HCL 2.5 MG/ML IV SOLN
INTRAVENOUS | Status: DC | PRN
  Administered 2024-04-06: 10 mL via INTRA_ARTERIAL

## 2024-04-06 MED ORDER — ASPIRIN 81 MG PO CHEW: 81.0000 mg | CHEWABLE_TABLET | ORAL | Status: DC

## 2024-04-06 MED ORDER — MIDAZOLAM HCL 2 MG/2ML IJ SOLN
INTRAMUSCULAR | Status: AC
  Filled 2024-04-06: qty 2

## 2024-04-06 MED ORDER — PHENYLEPHRINE HCL-NACL 20-0.9 MG/250ML-% IV SOLN
INTRAVENOUS | Status: DC | PRN
  Administered 2024-04-06: 35 ug/min via INTRAVENOUS

## 2024-04-06 MED ORDER — BUTAMBEN-TETRACAINE-BENZOCAINE 2-2-14 % EX AERO
INHALATION_SPRAY | CUTANEOUS | Status: AC
  Filled 2024-04-06: qty 20

## 2024-04-06 MED ORDER — HEPARIN (PORCINE) IN NACL 1000-0.9 UT/500ML-% IV SOLN
INTRAVENOUS | Status: DC | PRN
  Administered 2024-04-06: 1000 mL

## 2024-04-06 MED ORDER — PROPOFOL 10 MG/ML IV BOLUS
INTRAVENOUS | Status: DC | PRN
  Administered 2024-04-06: 100 mg via INTRAVENOUS

## 2024-04-06 MED ORDER — ONDANSETRON HCL 4 MG/2ML IJ SOLN: 4.0000 mg | Freq: Four times a day (QID) | INTRAMUSCULAR | Status: DC | PRN

## 2024-04-06 MED ORDER — SODIUM CHLORIDE 0.9 % IV SOLN: INTRAVENOUS | Status: DC

## 2024-04-06 MED ORDER — FENTANYL CITRATE (PF) 100 MCG/2ML IJ SOLN
INTRAMUSCULAR | Status: AC
Start: 2024-04-06 — End: ?
  Filled 2024-04-06: qty 2

## 2024-04-06 MED ORDER — VERAPAMIL HCL 2.5 MG/ML IV SOLN
INTRAVENOUS | Status: AC
  Filled 2024-04-06: qty 2

## 2024-04-06 MED ORDER — IOHEXOL 350 MG/ML SOLN
INTRAVENOUS | Status: DC | PRN
  Administered 2024-04-06: 30 mL

## 2024-04-06 MED ORDER — PROPOFOL 500 MG/50ML IV EMUL
INTRAVENOUS | Status: DC | PRN
  Administered 2024-04-06: 100 ug/kg/min via INTRAVENOUS

## 2024-04-06 MED ORDER — SODIUM CHLORIDE 0.9 % IV SOLN: 250.0000 mL | INTRAVENOUS | Status: DC | PRN

## 2024-04-06 MED ORDER — FENTANYL CITRATE (PF) 100 MCG/2ML IJ SOLN
INTRAMUSCULAR | Status: DC | PRN
  Administered 2024-04-06: 25 ug via INTRAVENOUS

## 2024-04-06 MED ORDER — HEPARIN SODIUM (PORCINE) 1000 UNIT/ML IJ SOLN
INTRAMUSCULAR | Status: DC | PRN
  Administered 2024-04-06: 3500 [IU] via INTRAVENOUS

## 2024-04-06 MED ORDER — LIDOCAINE 2% (20 MG/ML) 5 ML SYRINGE
INTRAMUSCULAR | Status: DC | PRN
  Administered 2024-04-06: 60 mg via INTRAVENOUS

## 2024-04-06 MED ORDER — SODIUM CHLORIDE 0.9% FLUSH: 3.0000 mL | Freq: Two times a day (BID) | INTRAVENOUS | Status: DC

## 2024-04-06 MED ORDER — LIDOCAINE HCL (PF) 1 % IJ SOLN
INTRAMUSCULAR | Status: DC | PRN
  Administered 2024-04-06: 5 mL

## 2024-04-06 MED ORDER — SODIUM CHLORIDE 0.9 % WEIGHT BASED INFUSION: 1.0000 mL/kg/h | INTRAVENOUS | Status: DC

## 2024-04-06 MED ORDER — SODIUM CHLORIDE 0.9% FLUSH: 3.0000 mL | INTRAVENOUS | Status: DC | PRN

## 2024-04-06 MED ORDER — SODIUM CHLORIDE 0.9 % WEIGHT BASED INFUSION: 3.0000 mL/kg/h | INTRAVENOUS | Status: AC

## 2024-04-06 MED ORDER — ACETAMINOPHEN 325 MG PO TABS: 650.0000 mg | ORAL_TABLET | ORAL | Status: DC | PRN

## 2024-04-06 MED ORDER — HEPARIN SODIUM (PORCINE) 1000 UNIT/ML IJ SOLN
INTRAMUSCULAR | Status: AC
  Filled 2024-04-06: qty 10

## 2024-04-06 MED ORDER — SODIUM CHLORIDE 0.9 % IV SOLN
INTRAVENOUS | Status: DC | PRN
  Administered 2024-04-06: 10 mL/h via INTRAVENOUS

## 2024-04-06 SURGICAL SUPPLY — 11 items
CATH 5FR JL3.5 JR4 ANG PIG MP (CATHETERS) IMPLANT
CATH BALLN WEDGE 5F 110CM (CATHETERS) IMPLANT
DEVICE RAD COMP TR BAND LRG (VASCULAR PRODUCTS) IMPLANT
GLIDESHEATH SLEND SS 6F .021 (SHEATH) IMPLANT
GUIDEWIRE INQWIRE 1.5J.035X260 (WIRE) IMPLANT
INQWIRE 1.5J .035X260CM (WIRE) ×1 IMPLANT
PACK CARDIAC CATHETERIZATION (CUSTOM PROCEDURE TRAY) ×1 IMPLANT
SET ATX-X65L (MISCELLANEOUS) IMPLANT
SHEATH GLIDE SLENDER 4/5FR (SHEATH) IMPLANT
SHEATH PROBE COVER 6X72 (BAG) IMPLANT
WIRE EMERALD 3MM-J .025X260CM (WIRE) IMPLANT

## 2024-04-06 NOTE — CV Procedure (Signed)
 INDICATIONS: Aortic valve regurgitation  PROCEDURE:   Informed consent was obtained prior to the procedure. The risks, benefits and alternatives for the procedure were discussed and the patient comprehended these risks.  Risks include, but are not limited to, cough, sore throat, vomiting, nausea, somnolence, esophageal and stomach trauma or perforation, bleeding, low blood pressure, aspiration, pneumonia, infection, trauma to the teeth and death.    After a procedural time-out, the oropharynx was anesthetized with 20% benzocaine  spray.   During this procedure the patient was administered propofol  per anesthesia.  The patient's heart rate, blood pressure, and oxygen  saturation were monitored continuously during the procedure. The period of conscious sedation was 30 minutes, of which I was present face-to-face 100% of this time.  The transesophageal probe was inserted in the esophagus and stomach without difficulty and multiple views were obtained.  The patient was kept under observation until the patient left the procedure room.  The patient left the procedure room in stable condition.   Agitated microbubble saline contrast was administered.  COMPLICATIONS:    There were no immediate complications.  FINDINGS:   FORMAL ECHOCARDIOGRAM REPORT PENDING EF 50-55% Normal RV size and function. Vena contracta 0.4 cm2 by 3D VC.  AI appears moderate to moderate severe.  Interrogation of descending aorta does not demonstrate holodiastolic reversals.  Measurements pending.  Normal size aorta with likely reverberation artifact from atherosclerosis.   RECOMMENDATIONS:     Proceed to cath.  Time Spent Directly with the Patient:  45 minutes   Aviannah Castoro A Sabrinna Yearwood 04/06/2024, 9:02 AM

## 2024-04-06 NOTE — Progress Notes (Signed)
  Echocardiogram Echocardiogram Transesophageal has been performed.  Katrina Daddona 04/06/2024, 9:08 AM

## 2024-04-06 NOTE — Interval H&P Note (Signed)
 History and Physical Interval Note:  04/06/2024 10:53 AM  Charlene Barnes  has presented today for surgery, with the diagnosis of aortic reguritation.  The various methods of treatment have been discussed with the patient and family. After consideration of risks, benefits and other options for treatment, the patient has consented to  Procedure(s): RIGHT/LEFT HEART CATH AND CORONARY ANGIOGRAPHY (N/A) as a surgical intervention.  The patient's history has been reviewed, patient examined, no change in status, stable for surgery.  I have reviewed the patient's chart and labs.  Questions were answered to the patient's satisfaction.     Donata Fryer [facility] 04/06/2024 10:54 AM

## 2024-04-06 NOTE — Anesthesia Preprocedure Evaluation (Signed)
 Anesthesia Evaluation  Patient identified by MRN, date of birth, ID band Patient awake    Reviewed: Allergy & Precautions, NPO status , Patient's Chart, lab work & pertinent test results, reviewed documented beta blocker date and time   History of Anesthesia Complications Negative for: history of anesthetic complications  Airway Mallampati: II  TM Distance: >3 FB     Dental no notable dental hx.    Pulmonary neg shortness of breath, pneumonia   breath sounds clear to auscultation       Cardiovascular hypertension, (-) CAD, (-) Past MI and (-) Cardiac Stents + Valvular Problems/Murmurs (severe AI) AI  Rhythm:Regular Rate:Bradycardia     Neuro/Psych neg Seizures PSYCHIATRIC DISORDERS  Depression       GI/Hepatic ,neg GERD  ,,(+) neg Cirrhosis        Endo/Other    Renal/GU Renal disease     Musculoskeletal  (+) Arthritis , Osteoarthritis,    Abdominal   Peds  Hematology   Anesthesia Other Findings   Reproductive/Obstetrics                             Anesthesia Physical Anesthesia Plan  ASA: 3  Anesthesia Plan: MAC   Post-op Pain Management:    Induction: Intravenous  PONV Risk Score and Plan: 2 and Ondansetron  and Dexamethasone  Airway Management Planned:   Additional Equipment:   Intra-op Plan:   Post-operative Plan: Extubation in OR  Informed Consent: I have reviewed the patients History and Physical, chart, labs and discussed the procedure including the risks, benefits and alternatives for the proposed anesthesia with the patient or authorized representative who has indicated his/her understanding and acceptance.     Dental advisory given  Plan Discussed with: CRNA  Anesthesia Plan Comments:        Anesthesia Quick Evaluation

## 2024-04-06 NOTE — Interval H&P Note (Signed)
 History and Physical Interval Note:  04/06/2024 7:43 AM  Charlene Barnes  has presented today for surgery, with the diagnosis of aortic valve regurgitation.  The various methods of treatment have been discussed with the patient and family. After consideration of risks, benefits and other options for treatment, the patient has consented to  Procedure(s): TRANSESOPHAGEAL ECHOCARDIOGRAM (N/A) as a surgical intervention.  The patient's history has been reviewed, patient examined, no change in status, stable for surgery.  I have reviewed the patient's chart and labs.  Questions were answered to the patient's satisfaction.     Hubert Raatz A [facility]

## 2024-04-06 NOTE — Transfer of Care (Signed)
 Immediate Anesthesia Transfer of Care Note  Patient: Charlene Barnes  Procedure(s) Performed: TRANSESOPHAGEAL ECHOCARDIOGRAM  Patient Location: PACU  Anesthesia Type:MAC  Level of Consciousness: awake, alert , and oriented  Airway & Oxygen  Therapy: Patient Spontanous Breathing  Post-op Assessment: Report given to RN  Post vital signs: Reviewed and stable  Last Vitals:  Vitals Value Taken Time  BP 138/101 04/06/24 0856  Temp 36.7 C 04/06/24 0856  Pulse 58 04/06/24 0857  Resp 20 04/06/24 0857  SpO2 98 % 04/06/24 0857  Vitals shown include unfiled device data.  Last Pain:  Vitals:   04/06/24 0856  TempSrc: Tympanic  PainSc: Asleep         Complications: No notable events documented.

## 2024-04-06 NOTE — Anesthesia Postprocedure Evaluation (Signed)
 Anesthesia Post Note  Patient: LOREDA SILVERIO  Procedure(s) Performed: TRANSESOPHAGEAL ECHOCARDIOGRAM     Patient location during evaluation: PACU Anesthesia Type: MAC Level of consciousness: awake and alert Pain management: pain level controlled Vital Signs Assessment: post-procedure vital signs reviewed and stable Respiratory status: spontaneous breathing, nonlabored ventilation, respiratory function stable and patient connected to nasal cannula oxygen  Cardiovascular status: stable and blood pressure returned to baseline Postop Assessment: no apparent nausea or vomiting Anesthetic complications: no   No notable events documented.  Last Vitals:  Vitals:   04/06/24 0906 04/06/24 0916  BP: (!) 115/44 (!) 117/53  Pulse: (!) 58 (!) 52  Resp: 19 17  Temp:    SpO2: 96% 96%    Last Pain:  Vitals:   04/06/24 1105  TempSrc:   PainSc: 0-No pain                 Lynwood MARLA Cornea

## 2024-04-08 ENCOUNTER — Encounter (HOSPITAL_COMMUNITY): Payer: Self-pay | Admitting: Internal Medicine

## 2024-04-17 ENCOUNTER — Ambulatory Visit: Payer: Medicare Other | Admitting: Cardiovascular Disease

## 2024-04-24 ENCOUNTER — Encounter: Payer: Self-pay | Admitting: Cardiovascular Disease

## 2024-04-24 ENCOUNTER — Ambulatory Visit: Attending: Cardiovascular Disease | Admitting: Cardiovascular Disease

## 2024-04-24 VITALS — BP 110/54 | HR 83 | Ht 63.5 in | Wt 144.4 lb

## 2024-04-24 DIAGNOSIS — I351 Nonrheumatic aortic (valve) insufficiency: Secondary | ICD-10-CM

## 2024-04-24 NOTE — Progress Notes (Signed)
 [name] returns today with her husband Charlene Barnes discuss the results of her TEE right and left heart cardiac catheterization and evaluation of aortic insufficiency.  I last saw her in the office/1/25 as a referral from Dr. Tisovec.  She is progressive dyspnea.  She had a transthoracic echo that showed moderate to severe aortic insufficiency with a coronary calcium score of 0.  She had a TEE that confirmed moderate to severe AI and a right left heart cath that showed clean coronary arteries with a normal LVEDP and wedge.  Given her progressive symptoms, otherwise lack of comorbidities I am going refer her to Dr. Elita Guitar, at her request, for consideration of AVR.  Charlene Barnes, M.D., FACP, [facility], [facility] [address] [place], [place]  [address]  [phone] 04/24/2024 3:56 PM

## 2024-04-24 NOTE — Patient Instructions (Signed)
 Medication Instructions:  Your physician recommends that you continue on your current medications as directed. Please refer to the Current Medication list given to you today.  *If you need a refill on your cardiac medications before your next appointment, please call your pharmacy*  Follow-Up: At [facility], you and your health needs are our priority.  As part of our continuing mission to provide you with exceptional heart care, our providers are all part of one team.  This team includes your primary Cardiologist (physician) and Advanced Practice Providers or APPs (Physician Assistants and Nurse Practitioners) who all work together to provide you with the care you need, when you need it.  Your next appointment:   6 month(s)  Provider:   Lauro Portal, MD    We recommend signing up for the patient portal called "MyChart".  Sign up information is provided on this After Visit Summary.  MyChart is used to connect with patients for Virtual Visits (Telemedicine).  Patients are able to view lab/test results, encounter notes, upcoming appointments, etc.  Non-urgent messages can be sent to your provider as well.   To learn more about what you can do with MyChart, go to [email or web address].

## 2024-05-17 ENCOUNTER — Ambulatory Visit: Admitting: Surgery

## 2024-05-17 ENCOUNTER — Ambulatory Visit: Attending: Surgery | Admitting: Surgery

## 2024-05-17 ENCOUNTER — Encounter: Payer: Self-pay | Admitting: Surgery

## 2024-05-17 VITALS — BP 135/61 | HR 50 | Resp 20 | Ht 63.5 in | Wt 145.5 lb

## 2024-05-17 DIAGNOSIS — I351 Nonrheumatic aortic (valve) insufficiency: Secondary | ICD-10-CM | POA: Diagnosis not present

## 2024-05-20 ENCOUNTER — Encounter: Payer: Self-pay | Admitting: Surgery

## 2024-05-20 NOTE — Progress Notes (Signed)
 [address] [place] [address]             [phone]     Cardiothoracic Surgery Consultation  PCP is Tisovec, Kristina Pfeiffer, MD Referring Provider is Avanell Leigh, MD  Chief Complaint  Patient presents with   Aortic Insuffiency    New patient consult, CATH/ TEE 4/18, ECHO 3/12    HPI:  The patient is a 78 year old woman with history of hypertension, hyperlipidemia, osteoarthritis and restless leg syndrome who presents with increasing shortness of breath over the past 6 months or so.  She feels like this has been present for about the past 2 years.  She has had no chest discomfort.  She denies any dizziness or syncope.  She denies orthopnea and PND.  She has noticed some swelling in her legs.  She had a 2D echo on 02/29/2024 showing moderate to severe central aortic insufficiency.  The aortic valve appeared trileaflet without stenosis.  Left ventricular ejection fraction was 55 to 60% with a diastolic diameter of 5.25 cm.  Right ventricular function was normal.  There is trivial MR.  She had a calcium scoring CT scan on 03/12/2024 which showed a coronary calcium score of 0.  TEE on 04/06/2024 showed low normal left ventricular systolic function with ejection fraction of 50 to 55%.  There was felt to be moderate to severe aortic insufficiency with a 3D VC of 0.4 cm.  Cardiac catheterization on the same day showed normal coronary anatomy without stenosis.  Left and right heart filling pressures were within normal limits.  Cardiac index was 3.32.  LVEDP was normal.  She is here today with her husband. Past Medical History:  Diagnosis Date   Cataracts, bilateral    Depression    with OCD, impule disorder   Hyperlipidemia    Hypertension    Impaired glucose tolerance    Osteoarthritis    feet/ ankles   Osteopenia    hips   Restless legs syndrome    RLL pneumonia    02/2018    Past Surgical History:  Procedure Laterality Date   KNEE SURGERY      RIGHT/LEFT HEART CATH AND CORONARY ANGIOGRAPHY N/A 04/06/2024   Procedure: RIGHT/LEFT HEART CATH AND CORONARY ANGIOGRAPHY;  Surgeon: Swaziland, Peter M, MD;  Location: [facility] INVASIVE CV LAB;  Service: Cardiovascular;  Laterality: N/A;   SHOULDER SURGERY     rotator cuff, 10/2021   TRANSESOPHAGEAL ECHOCARDIOGRAM (CATH LAB) N/A 04/06/2024   Procedure: TRANSESOPHAGEAL ECHOCARDIOGRAM;  Surgeon: Euell Herrlich, MD;  Location: [facility] INVASIVE CV LAB;  Service: Cardiovascular;  Laterality: N/A;    Family History  Problem Relation Age of Onset   Parkinsonism Mother    Lymphoma Mother    Hypertension Mother    Other Father        natural   Osteoarthritis Brother    Restless legs syndrome Brother    Osteoarthritis Brother    Restless legs syndrome Brother     Social History Social History   Tobacco Use   Smoking status: Never  Vaping Use   Vaping status: Never Used  Substance Use Topics   Alcohol use: Yes    Comment: occasionally   Drug use: Not Currently    Types: Marijuana    Comment: on occasion    Current Outpatient Medications  Medication Sig Dispense Refill   alendronate (FOSAMAX) 70 MG tablet Take 70 mg by mouth once a week.  amLODipine (NORVASC) 10 MG tablet Take 10 mg by mouth daily.     b complex vitamins capsule Take 1 capsule by mouth daily.     diclofenac (VOLTAREN) 75 MG EC tablet Take 75 mg by mouth 2 (two) times daily as needed for moderate pain (pain score 4-6).     diclofenac Sodium (VOLTAREN) 1 % GEL Apply 2 g topically as needed.     Multiple Vitamins-Minerals (MULTIVITAMIN WITH MINERALS) tablet Take 1 tablet by mouth daily.     olmesartan (BENICAR) 20 MG tablet Take 20 mg by mouth daily.     PARoxetine (PAXIL) 20 MG tablet Take 20 mg by mouth daily.     pramipexole (MIRAPEX) 1.5 MG tablet Take 1.5 mg by mouth at bedtime.     traZODone (DESYREL) 150 MG tablet Take 150 mg by mouth at bedtime.     TURMERIC CURCUMIN PO Take 1 capsule by mouth 2 (two) times daily.      No current facility-administered medications for this visit.    Allergies  Allergen Reactions   Latex Rash    Review of Systems  Constitutional:  Positive for activity change and fatigue.  HENT: Negative.    Eyes: Negative.   Respiratory:  Positive for shortness of breath. Negative for chest tightness.   Cardiovascular:  Positive for leg swelling. Negative for chest pain and palpitations.  Gastrointestinal: Negative.   Endocrine: Negative.   Genitourinary: Negative.   Musculoskeletal: Negative.   Skin: Negative.   Allergic/Immunologic: Negative.   Neurological:  Negative for dizziness and syncope.  Hematological: Negative.   Psychiatric/Behavioral: Negative.      BP 135/61 (BP Location: Left Arm, Patient Position: Sitting, Cuff Size: Normal)   Pulse (!) 50   Resp 20   Ht 5' 3.5" (1.613 m)   Wt 145 lb 8 oz (66 kg)   SpO2 97% Comment: RA  BMI 25.37 kg/m  Physical Exam Constitutional:      Appearance: Normal appearance. She is normal weight.  HENT:     Head: Normocephalic and atraumatic.  Eyes:     Extraocular Movements: Extraocular movements intact.     Conjunctiva/sclera: Conjunctivae normal.     Pupils: Pupils are equal, round, and reactive to light.  Neck:     Vascular: No carotid bruit.  Cardiovascular:     Rate and Rhythm: Normal rate and regular rhythm.     Heart sounds: Murmur heard.     Comments: 2/6 diastolic murmur LLSB Pulmonary:     Effort: Pulmonary effort is normal.     Breath sounds: Normal breath sounds.  Musculoskeletal:        General: No swelling.  Skin:    General: Skin is warm and dry.  Neurological:     General: No focal deficit present.     Mental Status: She is alert and oriented to person, place, and time.  Psychiatric:        Mood and Affect: Mood normal.        Behavior: Behavior normal.     Diagnostic Tests:      TRANSESOPHOGEAL ECHO REPORT       Patient Name:   Charlene Barnes Date of Exam: 04/06/2024  Medical Rec  #:  [id number]       Height:       63.5 in  Accession #:    [id number]      Weight:       142.0 lb  Date of Birth:  October 28, 1946  BSA:          1.682 m  Patient Age:    77 years        BP:           118/52 mmHg  Patient Gender: F               HR:           46 bpm.  Exam Location:  Outpatient   Procedure: Transesophageal Echo, 3D Echo, Saline Contrast Bubble Study,  Color            Doppler and Cardiac Doppler (Both Spectral and Color Flow  Doppler            were utilized during procedure).   Indications:     aortic regurgitation    History:         Patient has prior history of Echocardiogram examinations,  most                  recent 02/29/2024. Risk Factors:Hypertension and  Dyslipidemia.    Sonographer:    Dione Franks RDCS  Referring Phys:  [id number] Euell Herrlich  Diagnosing Phys: Grady Lawman MD   PROCEDURE: After discussion of the risks and benefits of a TEE, an  informed consent was obtained from the patient. The transesophogeal probe  was passed without difficulty through the esophogus of the patient. Imaged  were obtained with the patient in a  left lateral decubitus position. Local oropharyngeal anesthetic was  provided with Cetacaine . Sedation performed by different physician. The  patient was monitored while under deep sedation. Anesthestetic sedation  was provided intravenously by  Anesthesiology: 454mg  of Propofol , 60mg  of Lidocaine . Image quality was  good. The patient's vital signs; including heart rate, blood pressure, and  oxygen  saturation; remained stable throughout the procedure. The patient  developed no complications during  the procedure.    IMPRESSIONS     1. Left ventricular ejection fraction, by estimation, is 50 to 55%. The  left ventricle has low normal function.   2. Right ventricular systolic function is normal. The right ventricular  size is normal.   3. No left atrial/left atrial appendage thrombus was detected.   4. The  mitral valve is degenerative. Trivial mitral valve regurgitation.   5. The aortic valve is tricuspid. There is mild calcification of the  aortic valve. There is mild thickening of the aortic valve. Aortic valve  regurgitation is moderate to severe, 3D VC 0.4 cm2, central AI. Descending  aorta reverals that are not  holodiastolic.   6. There is mild (Grade II) plaque involving the aortic arch and  descending aorta.   7. 3D performed of the aortic valve and demonstrates degenerative valve  change and central AI.   FINDINGS   Left Ventricle: Left ventricular ejection fraction, by estimation, is 50  to 55%. The left ventricle has low normal function. The left ventricular  internal cavity size was normal in size.   Right Ventricle: The right ventricular size is normal. No increase in  right ventricular wall thickness. Right ventricular systolic function is  normal.   Left Atrium: Left atrial size was normal in size. No left atrial/left  atrial appendage thrombus was detected.   Right Atrium: Right atrial size was normal in size.   Pericardium: There is no evidence of pericardial effusion.   Mitral Valve: The mitral valve is degenerative in appearance. Trivial  mitral valve regurgitation.   Tricuspid Valve: The tricuspid valve is  normal in structure. Tricuspid  valve regurgitation is trivial. No evidence of tricuspid stenosis.   Aortic Valve: The aortic valve is tricuspid. There is mild calcification  of the aortic valve. There is mild thickening of the aortic valve. Aortic  valve regurgitation is moderate to severe. Aortic valve mean gradient  measures 4.0 mmHg. Aortic valve peak  gradient measures 8.9 mmHg. Aortic valve area, by VTI measures 1.92 cm.   Pulmonic Valve: The pulmonic valve was normal in structure. Pulmonic valve  regurgitation is trivial. No evidence of pulmonic stenosis.   Aorta: Reverberation artifact in the ascending aorta. The aortic root and  ascending aorta  are structurally normal, with no evidence of dilitation.  There is mild (Grade II) plaque involving the aortic arch and descending  aorta.   IAS/Shunts: No atrial level shunt detected by color flow Doppler. Agitated  saline contrast was given intravenously to evaluate for intracardiac  shunting.   Additional Comments: 3D was performed not requiring image post processing  on an independent workstation and was abnormal. Spectral Doppler  performed.   LEFT VENTRICLE  PLAX 2D  LVOT diam:     2.00 cm  LV SV:         60  LV SV Index:   36  LVOT Area:     3.14 cm     AORTIC VALVE  AV Area (Vmax):    1.96 cm  AV Area (Vmean):   1.72 cm  AV Area (VTI):     1.92 cm  AV Vmax:           149.00 cm/s  AV Vmean:          93.000 cm/s  AV VTI:            0.314 m  AV Peak Grad:      8.9 mmHg  AV Mean Grad:      4.0 mmHg  LVOT Vmax:         93.00 cm/s  LVOT Vmean:        50.900 cm/s  LVOT VTI:          0.192 m  LVOT/AV VTI ratio: 0.61    AORTA  Ao Root diam: 3.30 cm  Ao Asc diam:  3.00 cm     SHUNTS  Systemic VTI:  0.19 m  Systemic Diam: 2.00 cm   Grady Lawman MD  Electronically signed by Grady Lawman MD  Signature Date/Time: 04/06/2024/2:48:38 PM        Final      Physicians  Panel Physicians Referring Physician Case Authorizing Physician  [place], Peter M, MD (Primary)     Procedures  RIGHT/LEFT HEART CATH AND CORONARY ANGIOGRAPHY   Conclusion      LV end diastolic pressure is normal.   Normal coronary anatomy Normal LV filling pressures. LVEDP 15 mm Hg, PCWP 16/15, mean 13 mm Hg Normal right heart pressures. PAP 32/10, mean 18 mm Hg Normal cardiac output. 5.59 L/min, Index 3.32.    Surgeon Notes    04/06/2024  9:05 AM CV Procedure signed by Euell Herrlich, MD   Indications  Nonrheumatic aortic valve insufficiency [I35.1 (ICD-10-CM)]   Procedural Details  Technical Details Indication:77 y o WF with aortic insufficiency  Procedural Details:  The right wrist was prepped, draped, and anesthetized with 1% lidocaine . Ultrasound was used to guide access. Image was obtained and stored in the record.  Using the modified Seldinger technique a 6 Fr slender sheath was placed in the right  radial artery and a 5 French sheath was placed in the right brachial vein. A Swan-Ganz catheter was used for the right heart catheterization. Standard protocol was followed for recording of right heart pressures and sampling of oxygen  saturations. Fick cardiac output was calculated. Standard Judkins catheters were used for selective coronary angiography and left ventriculography. There were no immediate procedural complications. The patient was transferred to the post catheterization recovery area for further monitoring.  Contrast: 30 cc Estimated blood loss <50 mL.   During this procedure medications were administered to achieve and maintain moderate conscious sedation while the patient's heart rate, blood pressure, and oxygen  saturation were continuously monitored and I was present face-to-face 100% of this time. Logan Boothe Rad Tech and [employer] Cardiovascular Specialist are independent, trained observers who assisted in the monitoring of the patient's level of consciousness.   Medications (Filter: Administrations occurring from 1050 to 1149 on 04/06/24)  important  Continuous medications are totaled by the amount administered until 04/06/24 1149.   midazolam  (VERSED ) injection (mg)  Total dose: 1 mg Date/Time Rate/Dose/Volume Action   04/06/24 1106 1 mg Given   fentaNYL  (SUBLIMAZE ) injection (mcg)  Total dose: 25 mcg Date/Time Rate/Dose/Volume Action   04/06/24 1106 25 mcg Given   lidocaine  (PF) (XYLOCAINE ) 1 % injection (mL)  Total volume: 5 mL Date/Time Rate/Dose/Volume Action   04/06/24 1114 5 mL Given   Radial Cocktail/Verapamil  only (mL)  Total volume: 10 mL Date/Time Rate/Dose/Volume Action   04/06/24 1114 10 mL Given   Heparin   (Porcine) in NaCl 1000-0.9 UT/500ML-% SOLN (mL)  Total volume: 1,000 mL Date/Time Rate/Dose/Volume Action   04/06/24 1114 1,000 mL Given   heparin  sodium (porcine) injection (Units)  Total dose: 3,500 Units Date/Time Rate/Dose/Volume Action   04/06/24 1133 3,500 Units Given   iohexol  (OMNIPAQUE ) 350 MG/ML injection (mL)  Total volume: 30 mL Date/Time Rate/Dose/Volume Action   04/06/24 1144 30 mL Given   0.9 %  sodium chloride  infusion (mL/hr)  Total volume: 7.31 mL Date/Time Rate/Dose/Volume Action   04/06/24 1106 10 mL/hr New Bag/Given    Sedation Time  Sedation Time Physician-1: 27 minutes 51 seconds Contrast     Administrations occurring from 1050 to 1149 on 04/06/24:  Medication Name Total Dose  iohexol  (OMNIPAQUE ) 350 MG/ML injection 30 mL   Radiation/Fluoro  Fluoro time: 11.1 (min) DAP: 12666 (mGycm2) Cumulative Air Kerma: 195 (mGy) Complications  Complications documented before study signed (04/06/2024 11:56 AM)   No complications were associated with this study.  Documented by [place], Peter M, MD - 04/06/2024 11:43 AM     Coronary Findings  Diagnostic Dominance: Left Left Main  Vessel was injected. Vessel is normal in caliber. Vessel is angiographically normal.    Left Anterior Descending  Vessel was injected. Vessel is normal in caliber. Vessel is angiographically normal.    Left Circumflex  Vessel was injected. Vessel is large. Vessel is angiographically normal.    Right Coronary Artery  Vessel was injected. Vessel is small. Vessel is angiographically normal.    Intervention   No interventions have been documented.   Left Heart  Left Ventricle LV end diastolic pressure is normal.   Coronary Diagrams  Diagnostic Dominance: Left  Intervention   Implants   No implant documentation for this case.   Syngo Images   Show images for CARDIAC CATHETERIZATION Images on Long Term Storage   Show images for Dulce, Martian to Procedure  Log  Procedure Log    Hemo Data  Flowsheet  Row Most Recent Value  Fick Cardiac Output 5.59 L/min  Fick Cardiac Output Index 3.32 (L/min)/BSA  RA A Wave 12 mmHg  RA V Wave 9 mmHg  RA Mean 9 mmHg  RV Systolic Pressure 36 mmHg  RV Diastolic Pressure 4 mmHg  RV EDP 13 mmHg  PA Systolic Pressure 32 mmHg  PA Diastolic Pressure 10 mmHg  PA Mean 18 mmHg  PW A Wave 16 mmHg  PW V Wave 15 mmHg  PW Mean 13 mmHg  AO Systolic Pressure 131 mmHg  AO Diastolic Pressure 54 mmHg  AO Mean 82 mmHg  LV Systolic Pressure 148 mmHg  LV Diastolic Pressure 12 mmHg  LV EDP 26 mmHg  Arterial Occlusion Pressure Extended Systolic Pressure 142 mmHg  Arterial Occlusion Pressure Extended Diastolic Pressure 55 mmHg  Arterial Occlusion Pressure Extended Mean Pressure 86 mmHg  Left Ventricular Apex Extended Systolic Pressure 140 mmHg  LVp Diastolic Pressure 9 mmHg  Left Ventricular Apex Extended EDP Pressure 22 mmHg  QP/QS 1  TPVR Index 5.43 HRUI  TSVR Index 65.11 HRUI  PVR SVR Ratio 0.02  TPVR/TSVR Ratio 0.08    Impression:  This 78 year old woman has stage D, moderate to severe symptomatic aortic insufficiency with NYHA class II symptoms of exertional fatigue and shortness of breath consistent with chronic diastolic congestive heart failure.  She has mild left ventricular cavity dilation with low normal ejection fraction.  Cardiac catheterization showed no coronary disease with normal left and right heart filling pressures.  The ascending aorta appeared to be of normal caliber on her calcium scoring CT scan.  I agree that the best treatment for her is aortic valve replacement to improve her symptoms and prevent progressive left ventricular dysfunction.  Given her age I would recommend a bioprosthetic valve.  I reviewed the TEE and catheterization images with the patient and her husband and answered all of their questions.  I discussed the operative procedure with the patient and and her husband including  alternatives, benefits and risks; including but not limited to bleeding, blood transfusion, infection, stroke, myocardial infarction, graft failure, heart block requiring a permanent pacemaker, organ dysfunction, and death.  Budd Cargo Akkerman understands and agrees to proceed.     Plan:  She will call us  back to schedule aortic valve replacement using a bioprosthetic valve.  I spent 60 minutes performing this consultation and > 50% of this time was spent face to face counseling and coordinating the care of this patient's symptomatic moderate to severe aortic insufficiency.  Bartley Lightning, MD [facility] [phone]

## 2024-05-21 ENCOUNTER — Encounter: Payer: Self-pay | Admitting: *Deleted

## 2024-05-21 ENCOUNTER — Other Ambulatory Visit: Payer: Self-pay | Admitting: *Deleted

## 2024-05-21 DIAGNOSIS — I351 Nonrheumatic aortic (valve) insufficiency: Secondary | ICD-10-CM

## 2024-06-05 NOTE — Pre-Procedure Instructions (Addendum)
 Surgical Instructions   Your procedure is scheduled on June 07, 2024. Report to [facility] Main Entrance A at 5:30 A.M., then check in with the Admitting office. Any questions or running late day of surgery: call [phone]  Questions prior to your surgery date: call [phone], Monday-Friday, 8am-4pm. If you experience any cold or flu symptoms such as cough, fever, chills, shortness of breath, etc. between now and your scheduled surgery, please notify us  at the above number.     Remember:  Do not eat or drink after midnight the night before your surgery    Take these medicines the morning of surgery with A SIP OF WATER: amLODipine (NORVASC)  PARoxetine (PAXIL)    May take these medicines IF NEEDED: acetaminophen  (TYLENOL )  clonazePAM (KLONOPIN)    One week prior to surgery, STOP taking any Aspirin  (unless otherwise instructed by your surgeon) Aleve, Naproxen, Ibuprofen, Motrin, Advil, Goody's, BC's, all herbal medications, fish oil, and non-prescription vitamins. This includes your medication: diclofenac (VOLTAREN) tablet and GEL                      Do NOT Smoke (Tobacco/Vaping) for 24 hours prior to your procedure.  If you use a CPAP at night, you may bring your mask/headgear for your overnight stay.   You will be asked to remove any contacts, glasses, piercing's, hearing aid's, dentures/partials prior to surgery. Please bring cases for these items if needed.    Patients discharged the day of surgery will not be allowed to drive home, and someone needs to stay with them for 24 hours.  SURGICAL WAITING ROOM VISITATION Patients may have no more than 2 support people in the waiting area - these visitors may rotate.   Pre-op nurse will coordinate an appropriate time for 1 ADULT support person, who may not rotate, to accompany patient in pre-op.  Children under the age of 11 must have an adult with them who is not the patient and must remain in the main waiting area with an  adult.  If the patient needs to stay at the hospital during part of their recovery, the visitor guidelines for inpatient rooms apply.  Please refer to the [facility] website for the visitor guidelines for any additional information.   If you received a COVID test during your pre-op visit  it is requested that you wear a mask when out in public, stay away from anyone that may not be feeling well and notify your surgeon if you develop symptoms. If you have been in contact with anyone that has tested positive in the last 10 days please notify you surgeon.      Pre-operative CHG Bathing Instructions   You can play a key role in reducing the risk of infection after surgery. Your skin needs to be as free of germs as possible. You can reduce the number of germs on your skin by washing with CHG (chlorhexidine gluconate) soap before surgery. CHG is an antiseptic soap that kills germs and continues to kill germs even after washing.   DO NOT use if you have an allergy to chlorhexidine/CHG or antibacterial soaps. If your skin becomes reddened or irritated, stop using the CHG and notify one of our RNs at [phone].              TAKE A SHOWER THE NIGHT BEFORE SURGERY AND THE DAY OF SURGERY    Please keep in mind the following:  DO NOT shave, including legs and underarms,  48 hours prior to surgery.   You may shave your face before/day of surgery.  Place clean sheets on your bed the night before surgery Use a clean washcloth (not used since being washed) for each shower. DO NOT sleep with pet's night before surgery.  CHG Shower Instructions:  Wash your face and private area with normal soap. If you choose to wash your hair, wash first with your normal shampoo.  After you use shampoo/soap, rinse your hair and body thoroughly to remove shampoo/soap residue.  Turn the water OFF and apply half the bottle of CHG soap to a CLEAN washcloth.  Apply CHG soap ONLY FROM YOUR NECK DOWN TO YOUR TOES (washing  for 3-5 minutes)  DO NOT use CHG soap on face, private areas, open wounds, or sores.  Pay special attention to the area where your surgery is being performed.  If you are having back surgery, having someone wash your back for you may be helpful. Wait 2 minutes after CHG soap is applied, then you may rinse off the CHG soap.  Pat dry with a clean towel  Put on clean pajamas    Additional instructions for the day of surgery: DO NOT APPLY any lotions, deodorants, cologne, or perfumes.   Do not wear jewelry or makeup Do not wear nail polish, gel polish, artificial nails, or any other type of covering on natural nails (fingers and toes) Do not bring valuables to the hospital. [facility] is not responsible for valuables/personal belongings. Put on clean/comfortable clothes.  Please brush your teeth.  Ask your nurse before applying any prescription medications to the skin.

## 2024-06-06 ENCOUNTER — Encounter (HOSPITAL_COMMUNITY): Payer: Self-pay

## 2024-06-06 ENCOUNTER — Encounter: Admitting: Surgery

## 2024-06-06 ENCOUNTER — Encounter (HOSPITAL_COMMUNITY)
Admission: RE | Admit: 2024-06-06 | Discharge: 2024-06-06 | Disposition: A | Discharge status: Home / Self Care | Source: Ambulatory Visit | Attending: Surgery

## 2024-06-06 ENCOUNTER — Other Ambulatory Visit: Payer: Self-pay

## 2024-06-06 ENCOUNTER — Ambulatory Visit (HOSPITAL_COMMUNITY)
Admission: RE | Admit: 2024-06-06 | Discharge: 2024-06-06 | Disposition: A | Discharge status: Home / Self Care | Source: Ambulatory Visit | Attending: Surgery | Admitting: Surgery

## 2024-06-06 ENCOUNTER — Ambulatory Visit (HOSPITAL_BASED_OUTPATIENT_CLINIC_OR_DEPARTMENT_OTHER)
Admission: RE | Admit: 2024-06-06 | Discharge: 2024-06-06 | Disposition: A | Discharge status: Home / Self Care | Source: Ambulatory Visit | Attending: Surgery | Admitting: Surgery

## 2024-06-06 VITALS — BP 139/62 | HR 48 | Temp 98.1°F | Resp 17 | Ht 64.0 in | Wt 139.8 lb

## 2024-06-06 DIAGNOSIS — Z01818 Encounter for other preprocedural examination: Secondary | ICD-10-CM | POA: Insufficient documentation

## 2024-06-06 DIAGNOSIS — I351 Nonrheumatic aortic (valve) insufficiency: Secondary | ICD-10-CM | POA: Insufficient documentation

## 2024-06-06 LAB — URINALYSIS, ROUTINE W REFLEX MICROSCOPIC
Bacteria, UA: NONE SEEN
Bilirubin Urine: NEGATIVE
Glucose, UA: NEGATIVE mg/dL
Ketones, ur: NEGATIVE mg/dL
Leukocytes,Ua: NEGATIVE
Nitrite: NEGATIVE
Protein, ur: NEGATIVE mg/dL
Specific Gravity, Urine: 1.005 (ref 1.005–1.030)
pH: 7 (ref 5.0–8.0)

## 2024-06-06 LAB — CBC
HCT: 42.4 % (ref 36.0–46.0)
Hemoglobin: 13.6 g/dL (ref 12.0–15.0)
MCH: 29.7 pg (ref 26.0–34.0)
MCHC: 32.1 g/dL (ref 30.0–36.0)
MCV: 92.6 fL (ref 80.0–100.0)
Platelets: 375 10*3/uL (ref 150–400)
RBC: 4.58 MIL/uL (ref 3.87–5.11)
RDW: 15.2 % (ref 11.5–15.5)
WBC: 10.7 10*3/uL — ABNORMAL HIGH (ref 4.0–10.5)
nRBC: 0 % (ref 0.0–0.2)

## 2024-06-06 LAB — HEMOGLOBIN A1C
Hgb A1c MFr Bld: 5.4 % (ref 4.8–5.6)
Mean Plasma Glucose: 108.28 mg/dL

## 2024-06-06 LAB — SURGICAL PCR SCREEN
MRSA, PCR: NEGATIVE
Staphylococcus aureus: NEGATIVE

## 2024-06-06 LAB — COMPREHENSIVE METABOLIC PANEL WITH GFR
ALT: 17 U/L (ref 0–44)
AST: 16 U/L (ref 15–41)
Albumin: 3.6 g/dL (ref 3.5–5.0)
Alkaline Phosphatase: 60 U/L (ref 38–126)
Anion gap: 3 — ABNORMAL LOW (ref 5–15)
BUN: 11 mg/dL (ref 8–23)
CO2: 29 mmol/L (ref 22–32)
Calcium: 8.9 mg/dL (ref 8.9–10.3)
Chloride: 103 mmol/L (ref 98–111)
Creatinine, Ser: 0.73 mg/dL (ref 0.44–1.00)
GFR, Estimated: >60 mL/min (ref >60)
Glucose, Bld: 95 mg/dL (ref 70–99)
Potassium: 4.5 mmol/L (ref 3.5–5.1)
Sodium: 135 mmol/L (ref 135–145)
Total Bilirubin: 1 mg/dL (ref 0.0–1.2)
Total Protein: 6.9 g/dL (ref 6.5–8.1)

## 2024-06-06 LAB — PROTIME-INR
INR: 1 (ref 0.8–1.2)
Prothrombin Time: 13.4 s (ref 11.4–15.2)

## 2024-06-06 LAB — TYPE AND SCREEN
ABO/RH(D): O NEG
Antibody Screen: NEGATIVE

## 2024-06-06 LAB — APTT: aPTT: 29 s (ref 24–36)

## 2024-06-06 MED ORDER — EPINEPHRINE HCL 5 MG/250ML IV SOLN IN NS
0.0000 ug/min | INTRAVENOUS | Status: DC
  Filled 2024-06-06: qty 250

## 2024-06-06 MED ORDER — NOREPINEPHRINE 4 MG/250ML-% IV SOLN
0.0000 ug/min | INTRAVENOUS | Status: DC
  Filled 2024-06-06: qty 250

## 2024-06-06 MED ORDER — PHENYLEPHRINE HCL-NACL 20-0.9 MG/250ML-% IV SOLN
30.0000 ug/min | INTRAVENOUS | Status: AC
  Administered 2024-06-07: 20 ug/min via INTRAVENOUS
  Filled 2024-06-06: qty 250

## 2024-06-06 MED ORDER — DEXMEDETOMIDINE HCL IN NACL 400 MCG/100ML IV SOLN
0.1000 ug/kg/h | INTRAVENOUS | Status: AC
  Administered 2024-06-07: .4 ug/kg/h via INTRAVENOUS
  Filled 2024-06-06: qty 100

## 2024-06-06 MED ORDER — HEPARIN 30,000 UNITS/1000 ML (OHS) CELLSAVER SOLUTION
Status: DC
  Filled 2024-06-06: qty 1000

## 2024-06-06 MED ORDER — MILRINONE LACTATE IN DEXTROSE 20-5 MG/100ML-% IV SOLN
0.3000 ug/kg/min | INTRAVENOUS | Status: DC
  Filled 2024-06-06: qty 100

## 2024-06-06 MED ORDER — TRANEXAMIC ACID (OHS) PUMP PRIME SOLUTION
2.0000 mg/kg | INTRAVENOUS | Status: DC
  Filled 2024-06-06: qty 1.27

## 2024-06-06 MED ORDER — NITROGLYCERIN IN D5W 200-5 MCG/ML-% IV SOLN
2.0000 ug/min | INTRAVENOUS | Status: DC
  Filled 2024-06-06: qty 250

## 2024-06-06 MED ORDER — CEFAZOLIN SODIUM-DEXTROSE 2-4 GM/100ML-% IV SOLN
2.0000 g | INTRAVENOUS | Status: AC
  Administered 2024-06-07: 2 g via INTRAVENOUS
  Filled 2024-06-06: qty 100

## 2024-06-06 MED ORDER — TRANEXAMIC ACID (OHS) BOLUS VIA INFUSION
15.0000 mg/kg | INTRAVENOUS | Status: AC
  Administered 2024-06-07: 951 mg via INTRAVENOUS
  Filled 2024-06-06: qty 951

## 2024-06-06 MED ORDER — MANNITOL 20 % IV SOLN
INTRAVENOUS | Status: DC
  Filled 2024-06-06: qty 13

## 2024-06-06 MED ORDER — TRANEXAMIC ACID 1000 MG/10ML IV SOLN
1.5000 mg/kg/h | INTRAVENOUS | Status: DC
  Filled 2024-06-06 (×2): qty 25

## 2024-06-06 MED ORDER — PLASMA-LYTE A IV SOLN
INTRAVENOUS | Status: DC
  Filled 2024-06-06: qty 2.5

## 2024-06-06 MED ORDER — INSULIN REGULAR(HUMAN) IN NACL 100-0.9 UT/100ML-% IV SOLN
INTRAVENOUS | Status: AC
  Administered 2024-06-07: 6.5 [IU]/h via INTRAVENOUS
  Filled 2024-06-06: qty 100

## 2024-06-06 MED ORDER — POTASSIUM CHLORIDE 2 MEQ/ML IV SOLN
80.0000 meq | INTRAVENOUS | Status: DC
  Filled 2024-06-06: qty 40

## 2024-06-06 MED ORDER — VANCOMYCIN HCL 1250 MG/250ML IV SOLN
1250.0000 mg | INTRAVENOUS | Status: AC
  Administered 2024-06-07: 1250 mg via INTRAVENOUS
  Filled 2024-06-06: qty 250

## 2024-06-06 NOTE — Progress Notes (Signed)
 PCP - Rich Champ Tisovec,MD Cardiologist - Lauro Portal  PPM/ICD - denies Device Orders -  Rep Notified -   Chest x-ray - 06/06/24 EKG - 06/06/24 Stress Test - denies  ECHO - 04/06/24 Cardiac Cath - 04/06/24  Sleep Study -  CPAP -   Fasting Blood Sugar - na Checks Blood Sugar _____ times a day  Last dose of GLP1 agonist-  na GLP1 instructions:   Blood Thinner Instructions:na Aspirin  Instructions:na  ERAS Protcol -no PRE-SURGERY Ensure or G2-   COVID TEST- na   Anesthesia review: yes- pt had recent URI. Cefdinir started 6/11 BID for 5 days for productive cough,low grade fever. At PAT appointment, patient denied having fever, but said she had sore throat and cough. She said she had not had those symptoms since Sunday 6/15.  Patient denies shortness of breath, fever, cough and chest pain at PAT appointment   All instructions explained to the patient, with a verbal understanding of the material. Patient agrees to go over the instructions while at home for a better understanding.  The opportunity to ask questions was provided.

## 2024-06-06 NOTE — H&P (Signed)
 [address], [place] [address]             [phone]      Cardiothoracic Surgery Consultation   PCP is Tisovec, Kristina Pfeiffer, MD Referring Provider is Avanell Leigh, MD       Chief Complaint  Patient presents with   Aortic Insuffiency           HPI:   The patient is a 78 year old woman with history of hypertension, hyperlipidemia, osteoarthritis and restless leg syndrome who presents with increasing shortness of breath over the past 6 months or so.  She feels like this has been present for about the past 2 years.  She has had no chest discomfort.  She denies any dizziness or syncope.  She denies orthopnea and PND.  She has noticed some swelling in her legs.  She had a 2D echo on 02/29/2024 showing moderate to severe central aortic insufficiency.  The aortic valve appeared trileaflet without stenosis.  Left ventricular ejection fraction was 55 to 60% with a diastolic diameter of 5.25 cm.  Right ventricular function was normal.  There is trivial MR.  She had a calcium scoring CT scan on 03/12/2024 which showed a coronary calcium score of 0.  TEE on 04/06/2024 showed low normal left ventricular systolic function with ejection fraction of 50 to 55%.  There was felt to be moderate to severe aortic insufficiency with a 3D VC of 0.4 cm.  Cardiac catheterization on the same day showed normal coronary anatomy without stenosis.  Left and right heart filling pressures were within normal limits.  Cardiac index was 3.32.  LVEDP was normal.   She is married and lives with her husband.     Past Medical History:  Diagnosis Date   Cataracts, bilateral     Depression      with OCD, impule disorder   Hyperlipidemia     Hypertension     Impaired glucose tolerance     Osteoarthritis      feet/ ankles   Osteopenia      hips   Restless legs syndrome     RLL pneumonia      02/2018               Past Surgical History:  Procedure Laterality Date   KNEE SURGERY        RIGHT/LEFT HEART CATH AND CORONARY ANGIOGRAPHY N/A 04/06/2024    Procedure: RIGHT/LEFT HEART CATH AND CORONARY ANGIOGRAPHY;  Surgeon: [place]az[place], Peter M, MD;  Location: [facility] INVASIVE CV LAB;  Service: Cardiovascular;  Laterality: N/A;   SHOULDER SURGERY        rotator cuff, 10/2021   TRANSESOPHAGEAL ECHOCARDIOGRAM (CATH LAB) N/A 04/06/2024    Procedure: TRANSESOPHAGEAL ECHOCARDIOGRAM;  Surgeon: Euell Herrlich, MD;  Location: [facility] INVASIVE CV LAB;  Service: Cardiovascular;  Laterality: N/A;               Family History  Problem Relation Age of Onset   Parkinsonism Mother     Lymphoma Mother     Hypertension Mother     Other Father          natural   Osteoarthritis Brother     Restless legs syndrome Brother     Osteoarthritis Brother     Restless legs syndrome Brother            Social History Social History  Social History  Tobacco Use   Smoking status: Never  Vaping Use   Vaping status: Never Used  Substance Use Topics   Alcohol use: Yes      Comment: occasionally   Drug use: Not Currently      Types: Marijuana      Comment: on occasion              Current Outpatient Medications  Medication Sig Dispense Refill   alendronate (FOSAMAX) 70 MG tablet Take 70 mg by mouth once a week.       amLODipine (NORVASC) 10 MG tablet Take 10 mg by mouth daily.       b complex vitamins capsule Take 1 capsule by mouth daily.       diclofenac (VOLTAREN) 75 MG EC tablet Take 75 mg by mouth 2 (two) times daily as needed for moderate pain (pain score 4-6).       diclofenac Sodium (VOLTAREN) 1 % GEL Apply 2 g topically as needed.       Multiple Vitamins-Minerals (MULTIVITAMIN WITH MINERALS) tablet Take 1 tablet by mouth daily.       olmesartan (BENICAR) 20 MG tablet Take 20 mg by mouth daily.       PARoxetine (PAXIL) 20 MG tablet Take 20 mg by mouth daily.       pramipexole (MIRAPEX) 1.5 MG tablet Take 1.5 mg by mouth at bedtime.       traZODone (DESYREL) 150 MG tablet Take 150 mg  by mouth at bedtime.       TURMERIC CURCUMIN PO Take 1 capsule by mouth 2 (two) times daily.          No current facility-administered medications for this visit.        Allergies      Allergies  Allergen Reactions   Latex Rash        Review of Systems  Constitutional:  Positive for activity change and fatigue.  HENT: Negative.    Eyes: Negative.   Respiratory:  Positive for shortness of breath. Negative for chest tightness.   Cardiovascular:  Positive for leg swelling. Negative for chest pain and palpitations.  Gastrointestinal: Negative.   Endocrine: Negative.   Genitourinary: Negative.   Musculoskeletal: Negative.   Skin: Negative.   Allergic/Immunologic: Negative.   Neurological:  Negative for dizziness and syncope.  Hematological: Negative.   Psychiatric/Behavioral: Negative.        BP 135/61 (BP Location: Left Arm, Patient Position: Sitting, Cuff Size: Normal)   Pulse (!) 50   Resp 20   Ht 5' 3.5 (1.613 m)   Wt 145 lb 8 oz (66 kg)   SpO2 97% Comment: RA  BMI 25.37 kg/m  Physical Exam Constitutional:      Appearance: Normal appearance. She is normal weight.  HENT:     Head: Normocephalic and atraumatic.  Eyes:     Extraocular Movements: Extraocular movements intact.     Conjunctiva/sclera: Conjunctivae normal.     Pupils: Pupils are equal, round, and reactive to light.  Neck:     Vascular: No carotid bruit.  Cardiovascular:     Rate and Rhythm: Normal rate and regular rhythm.     Heart sounds: Murmur heard.     Comments: 2/6 diastolic murmur LLSB Pulmonary:     Effort: Pulmonary effort is normal.     Breath sounds: Normal breath sounds.  Musculoskeletal:        General: No swelling.  Skin:    General: Skin is warm and dry.  Neurological:     General: No focal deficit present.     Mental Status: She is alert and oriented to person, place, and time.  Psychiatric:        Mood and Affect: Mood normal.        Behavior: Behavior normal.         Diagnostic Tests:       TRANSESOPHOGEAL ECHO REPORT       Patient Name:   Charlene Barnes Date of Exam: 04/06/2024  Medical Rec #:  [id number]       Height:       63.5 in  Accession #:    [id number]      Weight:       142.0 lb  Date of Birth:  Aug 20, 1946        BSA:          1.682 m  Patient Age:    77 years        BP:           118/52 mmHg  Patient Gender: F               HR:           46 bpm.  Exam Location:  Outpatient   Procedure: Transesophageal Echo, 3D Echo, Saline Contrast Bubble Study,  Color            Doppler and Cardiac Doppler (Both Spectral and Color Flow  Doppler            were utilized during procedure).   Indications:     aortic regurgitation    History:         Patient has prior history of Echocardiogram examinations,  most                  recent 02/29/2024. Risk Factors:Hypertension and  Dyslipidemia.    Sonographer:    Dione Franks RDCS  Referring Phys:  [id number] Euell Herrlich  Diagnosing Phys: Grady Lawman MD   PROCEDURE: After discussion of the risks and benefits of a TEE, an  informed consent was obtained from the patient. The transesophogeal probe  was passed without difficulty through the esophogus of the patient. Imaged  were obtained with the patient in a  left lateral decubitus position. Local oropharyngeal anesthetic was  provided with Cetacaine . Sedation performed by different physician. The  patient was monitored while under deep sedation. Anesthestetic sedation  was provided intravenously by  Anesthesiology: 454mg  of Propofol , 60mg  of Lidocaine . Image quality was  good. The patient's vital signs; including heart rate, blood pressure, and  oxygen  saturation; remained stable throughout the procedure. The patient  developed no complications during  the procedure.    IMPRESSIONS     1. Left ventricular ejection fraction, by estimation, is 50 to 55%. The  left ventricle has low normal function.   2. Right ventricular systolic  function is normal. The right ventricular  size is normal.   3. No left atrial/left atrial appendage thrombus was detected.   4. The mitral valve is degenerative. Trivial mitral valve regurgitation.   5. The aortic valve is tricuspid. There is mild calcification of the  aortic valve. There is mild thickening of the aortic valve. Aortic valve  regurgitation is moderate to severe, 3D VC 0.4 cm2, central AI. Descending  aorta reverals that are not  holodiastolic.   6. There is mild (Grade II) plaque involving the aortic arch and  descending aorta.   7.  3D performed of the aortic valve and demonstrates degenerative valve  change and central AI.   FINDINGS   Left Ventricle: Left ventricular ejection fraction, by estimation, is 50  to 55%. The left ventricle has low normal function. The left ventricular  internal cavity size was normal in size.   Right Ventricle: The right ventricular size is normal. No increase in  right ventricular wall thickness. Right ventricular systolic function is  normal.   Left Atrium: Left atrial size was normal in size. No left atrial/left  atrial appendage thrombus was detected.   Right Atrium: Right atrial size was normal in size.   Pericardium: There is no evidence of pericardial effusion.   Mitral Valve: The mitral valve is degenerative in appearance. Trivial  mitral valve regurgitation.   Tricuspid Valve: The tricuspid valve is normal in structure. Tricuspid  valve regurgitation is trivial. No evidence of tricuspid stenosis.   Aortic Valve: The aortic valve is tricuspid. There is mild calcification  of the aortic valve. There is mild thickening of the aortic valve. Aortic  valve regurgitation is moderate to severe. Aortic valve mean gradient  measures 4.0 mmHg. Aortic valve peak  gradient measures 8.9 mmHg. Aortic valve area, by VTI measures 1.92 cm.   Pulmonic Valve: The pulmonic valve was normal in structure. Pulmonic valve  regurgitation is  trivial. No evidence of pulmonic stenosis.   Aorta: Reverberation artifact in the ascending aorta. The aortic root and  ascending aorta are structurally normal, with no evidence of dilitation.  There is mild (Grade II) plaque involving the aortic arch and descending  aorta.   IAS/Shunts: No atrial level shunt detected by color flow Doppler. Agitated  saline contrast was given intravenously to evaluate for intracardiac  shunting.   Additional Comments: 3D was performed not requiring image post processing  on an independent workstation and was abnormal. Spectral Doppler  performed.   LEFT VENTRICLE  PLAX 2D  LVOT diam:     2.00 cm  LV SV:         60  LV SV Index:   36  LVOT Area:     3.14 cm     AORTIC VALVE  AV Area (Vmax):    1.96 cm  AV Area (Vmean):   1.72 cm  AV Area (VTI):     1.92 cm  AV Vmax:           149.00 cm/s  AV Vmean:          93.000 cm/s  AV VTI:            0.314 m  AV Peak Grad:      8.9 mmHg  AV Mean Grad:      4.0 mmHg  LVOT Vmax:         93.00 cm/s  LVOT Vmean:        50.900 cm/s  LVOT VTI:          0.192 m  LVOT/AV VTI ratio: 0.61    AORTA  Ao Root diam: 3.30 cm  Ao Asc diam:  3.00 cm     SHUNTS  Systemic VTI:  0.19 m  Systemic Diam: 2.00 cm   Grady Lawman MD  Electronically signed by Grady Lawman MD  Signature Date/Time: 04/06/2024/2:48:38 PM        Final        Physicians   Panel Physicians Referring Physician Case Authorizing Physician  [place], Peter M, MD (Primary)  Procedures   RIGHT/LEFT HEART CATH AND CORONARY ANGIOGRAPHY    Conclusion       LV end diastolic pressure is normal.   Normal coronary anatomy Normal LV filling pressures. LVEDP 15 mm Hg, PCWP 16/15, mean 13 mm Hg Normal right heart pressures. PAP 32/10, mean 18 mm Hg Normal cardiac output. 5.59 L/min, Index 3.32.    Surgeon Notes       04/06/2024  9:05 AM CV Procedure signed by Euell Herrlich, MD    Indications   Nonrheumatic aortic  valve insufficiency [I35.1 (ICD-10-CM)]    Procedural Details   Technical Details Indication:77 y o WF with aortic insufficiency  Procedural Details: The right wrist was prepped, draped, and anesthetized with 1% lidocaine . Ultrasound was used to guide access. Image was obtained and stored in the record.  Using the modified Seldinger technique a 6 Fr slender sheath was placed in the right radial artery and a 5 French sheath was placed in the right brachial vein. A Swan-Ganz catheter was used for the right heart catheterization. Standard protocol was followed for recording of right heart pressures and sampling of oxygen  saturations. Fick cardiac output was calculated. Standard Judkins catheters were used for selective coronary angiography and left ventriculography. There were no immediate procedural complications. The patient was transferred to the post catheterization recovery area for further monitoring.  Contrast: 30 cc Estimated blood loss <50 mL.   During this procedure medications were administered to achieve and maintain moderate conscious sedation while the patient's heart rate, blood pressure, and oxygen  saturation were continuously monitored and I was present face-to-face 100% of this time. Logan Boothe Rad Tech and [employer] Cardiovascular Specialist are independent, trained observers who assisted in the monitoring of the patient's level of consciousness.    Medications (Filter: Administrations occurring from 1050 to 1149 on 04/06/24)  important  Continuous medications are totaled by the amount administered until 04/06/24 1149.    midazolam  (VERSED ) injection (mg)  Total dose: 1 mg Date/Time Rate/Dose/Volume Action    04/06/24 1106 1 mg Given    fentaNYL  (SUBLIMAZE ) injection (mcg)  Total dose: 25 mcg Date/Time Rate/Dose/Volume Action    04/06/24 1106 25 mcg Given    lidocaine  (PF) (XYLOCAINE ) 1 % injection (mL)  Total volume: 5 mL Date/Time Rate/Dose/Volume Action     04/06/24 1114 5 mL Given    Radial Cocktail/Verapamil  only (mL)  Total volume: 10 mL Date/Time Rate/Dose/Volume Action    04/06/24 1114 10 mL Given    Heparin  (Porcine) in NaCl 1000-0.9 UT/500ML-% SOLN (mL)  Total volume: 1,000 mL Date/Time Rate/Dose/Volume Action    04/06/24 1114 1,000 mL Given    heparin  sodium (porcine) injection (Units)  Total dose: 3,500 Units Date/Time Rate/Dose/Volume Action    04/06/24 1133 3,500 Units Given    iohexol  (OMNIPAQUE ) 350 MG/ML injection (mL)  Total volume: 30 mL Date/Time Rate/Dose/Volume Action    04/06/24 1144 30 mL Given    0.9 %  sodium chloride  infusion (mL/hr)  Total volume: 7.31 mL Date/Time Rate/Dose/Volume Action    04/06/24 1106 10 mL/hr New Bag/Given      Sedation Time   Sedation Time Physician-1: 27 minutes 51 seconds Contrast        Administrations occurring from 1050 to 1149 on 04/06/24:  Medication Name Total Dose  iohexol  (OMNIPAQUE ) 350 MG/ML injection 30 mL    Radiation/Fluoro   Fluoro time: 11.1 (min) DAP: 12666 (mGycm2) Cumulative Air Kerma: 195 (mGy) Complications   Complications documented before study signed (04/06/2024  11:56 AM)    No complications were associated with this study.  Documented by [place], Peter M, MD - 04/06/2024 11:43 AM      Coronary Findings   Diagnostic Dominance: Left Left Main  Vessel was injected. Vessel is normal in caliber. Vessel is angiographically normal.    Left Anterior Descending  Vessel was injected. Vessel is normal in caliber. Vessel is angiographically normal.    Left Circumflex  Vessel was injected. Vessel is large. Vessel is angiographically normal.    Right Coronary Artery  Vessel was injected. Vessel is small. Vessel is angiographically normal.    Intervention    No interventions have been documented.    Left Heart   Left Ventricle LV end diastolic pressure is normal.    Coronary Diagrams   Diagnostic Dominance: Left  Intervention    Implants     No implant documentation for this case.    [employer] Images    Show images for CARDIAC CATHETERIZATION Images on Long Term Storage    Show images for Emika, Tiano to Procedure Log   Procedure Log    Hemo Data   Flowsheet Row Most Recent Value  Fick Cardiac Output 5.59 L/min  Fick Cardiac Output Index 3.32 (L/min)/BSA  RA A Wave 12 mmHg  RA V Wave 9 mmHg  RA Mean 9 mmHg  RV Systolic Pressure 36 mmHg  RV Diastolic Pressure 4 mmHg  RV EDP 13 mmHg  PA Systolic Pressure 32 mmHg  PA Diastolic Pressure 10 mmHg  PA Mean 18 mmHg  PW A Wave 16 mmHg  PW V Wave 15 mmHg  PW Mean 13 mmHg  AO Systolic Pressure 131 mmHg  AO Diastolic Pressure 54 mmHg  AO Mean 82 mmHg  LV Systolic Pressure 148 mmHg  LV Diastolic Pressure 12 mmHg  LV EDP 26 mmHg  Arterial Occlusion Pressure Extended Systolic Pressure 142 mmHg  Arterial Occlusion Pressure Extended Diastolic Pressure 55 mmHg  Arterial Occlusion Pressure Extended Mean Pressure 86 mmHg  Left Ventricular Apex Extended Systolic Pressure 140 mmHg  LVp Diastolic Pressure 9 mmHg  Left Ventricular Apex Extended EDP Pressure 22 mmHg  QP/QS 1  TPVR Index 5.43 HRUI  TSVR Index 65.11 HRUI  PVR SVR Ratio 0.02  TPVR/TSVR Ratio 0.08      Impression:   This 78 year old woman has stage D, moderate to severe symptomatic aortic insufficiency with NYHA class II symptoms of exertional fatigue and shortness of breath consistent with chronic diastolic congestive heart failure.  She has mild left ventricular cavity dilation with low normal ejection fraction.  Cardiac catheterization showed no coronary disease with normal left and right heart filling pressures.  The ascending aorta appeared to be of normal caliber on her calcium scoring CT scan.  I agree that the best treatment for her is aortic valve replacement to improve her symptoms and prevent progressive left ventricular dysfunction.  Given her age I would recommend a bioprosthetic valve.  I  reviewed the TEE and catheterization images with the patient and her husband and answered all of their questions.  I discussed the operative procedure with the patient and and her husband including alternatives, benefits and risks; including but not limited to bleeding, blood transfusion, infection, stroke, myocardial infarction, graft failure, heart block requiring a permanent pacemaker, organ dysfunction, and death.  Budd Cargo Naff understands and agrees to proceed.       Plan:   Aortic valve replacement using a bioprosthetic valve.     Adjoa Althouse K  Sherene Dilling, MD [facility] [phone]

## 2024-06-07 ENCOUNTER — Inpatient Hospital Stay (HOSPITAL_COMMUNITY): Admitting: Certified Registered Nurse Anesthetist

## 2024-06-07 ENCOUNTER — Encounter (HOSPITAL_COMMUNITY)
Admission: RE | Disposition: A | Discharge status: Home / Self Care | Payer: Self-pay | Source: Home / Self Care | Attending: Surgery

## 2024-06-07 ENCOUNTER — Encounter (HOSPITAL_COMMUNITY): Payer: Self-pay | Admitting: Surgery

## 2024-06-07 ENCOUNTER — Other Ambulatory Visit: Payer: Self-pay

## 2024-06-07 ENCOUNTER — Inpatient Hospital Stay (HOSPITAL_COMMUNITY)
Admission: RE | Admit: 2024-06-07 | Discharge: 2024-06-12 | DRG: 220 | Disposition: A | Discharge status: Home / Self Care | Attending: Surgery | Admitting: Surgery

## 2024-06-07 ENCOUNTER — Inpatient Hospital Stay (HOSPITAL_COMMUNITY)

## 2024-06-07 DIAGNOSIS — Z828 Family history of other disabilities and chronic diseases leading to disablement, not elsewhere classified: Secondary | ICD-10-CM

## 2024-06-07 DIAGNOSIS — Z8249 Family history of ischemic heart disease and other diseases of the circulatory system: Secondary | ICD-10-CM | POA: Diagnosis not present

## 2024-06-07 DIAGNOSIS — Z7983 Long term (current) use of bisphosphonates: Secondary | ICD-10-CM

## 2024-06-07 DIAGNOSIS — E785 Hyperlipidemia, unspecified: Secondary | ICD-10-CM | POA: Diagnosis present

## 2024-06-07 DIAGNOSIS — R001 Bradycardia, unspecified: Secondary | ICD-10-CM | POA: Diagnosis present

## 2024-06-07 DIAGNOSIS — J9811 Atelectasis: Secondary | ICD-10-CM | POA: Diagnosis not present

## 2024-06-07 DIAGNOSIS — Z7982 Long term (current) use of aspirin: Secondary | ICD-10-CM | POA: Diagnosis not present

## 2024-06-07 DIAGNOSIS — I351 Nonrheumatic aortic (valve) insufficiency: Secondary | ICD-10-CM | POA: Diagnosis present

## 2024-06-07 DIAGNOSIS — Z807 Family history of other malignant neoplasms of lymphoid, hematopoietic and related tissues: Secondary | ICD-10-CM | POA: Diagnosis not present

## 2024-06-07 DIAGNOSIS — G2581 Restless legs syndrome: Secondary | ICD-10-CM | POA: Diagnosis present

## 2024-06-07 DIAGNOSIS — I959 Hypotension, unspecified: Secondary | ICD-10-CM | POA: Diagnosis not present

## 2024-06-07 DIAGNOSIS — Z8261 Family history of arthritis: Secondary | ICD-10-CM

## 2024-06-07 DIAGNOSIS — D72829 Elevated white blood cell count, unspecified: Secondary | ICD-10-CM | POA: Diagnosis present

## 2024-06-07 DIAGNOSIS — Z952 Presence of prosthetic heart valve: Principal | ICD-10-CM

## 2024-06-07 DIAGNOSIS — I10 Essential (primary) hypertension: Secondary | ICD-10-CM

## 2024-06-07 DIAGNOSIS — Z820 Family history of epilepsy and other diseases of the nervous system: Secondary | ICD-10-CM | POA: Diagnosis not present

## 2024-06-07 DIAGNOSIS — M7989 Other specified soft tissue disorders: Secondary | ICD-10-CM | POA: Diagnosis present

## 2024-06-07 DIAGNOSIS — Z91040 Latex allergy status: Secondary | ICD-10-CM | POA: Diagnosis not present

## 2024-06-07 DIAGNOSIS — F429 Obsessive-compulsive disorder, unspecified: Secondary | ICD-10-CM | POA: Diagnosis present

## 2024-06-07 DIAGNOSIS — Z79899 Other long term (current) drug therapy: Secondary | ICD-10-CM

## 2024-06-07 HISTORY — PX: INTRAOPERATIVE TRANSESOPHAGEAL ECHOCARDIOGRAM: SHX5062

## 2024-06-07 HISTORY — PX: AORTIC VALVE REPLACEMENT: SHX41

## 2024-06-07 LAB — POCT I-STAT 7, (LYTES, BLD GAS, ICA,H+H)
Acid-Base Excess: 1 mmol/L (ref 0.0–2.0)
Acid-Base Excess: 1 mmol/L (ref 0.0–2.0)
Acid-base deficit: 2 mmol/L (ref 0.0–2.0)
Acid-base deficit: 3 mmol/L — ABNORMAL HIGH (ref 0.0–2.0)
Acid-base deficit: 5 mmol/L — ABNORMAL HIGH (ref 0.0–2.0)
Bicarbonate: 26.3 mmol/L (ref 20.0–28.0)
Bicarbonate: 23.3 mmol/L (ref 20.0–28.0)
Bicarbonate: 24.9 mmol/L (ref 20.0–28.0)
Bicarbonate: 21.7 mmol/L (ref 20.0–28.0)
Bicarbonate: 20.6 mmol/L (ref 20.0–28.0)
Calcium, Ion: 1.22 mmol/L (ref 1.15–1.40)
Calcium, Ion: 1.08 mmol/L — ABNORMAL LOW (ref 1.15–1.40)
Calcium, Ion: 1.01 mmol/L — ABNORMAL LOW (ref 1.15–1.40)
Calcium, Ion: 1.09 mmol/L — ABNORMAL LOW (ref 1.15–1.40)
Calcium, Ion: 1.11 mmol/L — ABNORMAL LOW (ref 1.15–1.40)
HCT: 37 % (ref 36.0–46.0)
HCT: 26 % — ABNORMAL LOW (ref 36.0–46.0)
HCT: 26 % — ABNORMAL LOW (ref 36.0–46.0)
HCT: 33 % — ABNORMAL LOW (ref 36.0–46.0)
HCT: 32 % — ABNORMAL LOW (ref 36.0–46.0)
Hemoglobin: 12.6 g/dL (ref 12.0–15.0)
Hemoglobin: 8.8 g/dL — ABNORMAL LOW (ref 12.0–15.0)
Hemoglobin: 8.8 g/dL — ABNORMAL LOW (ref 12.0–15.0)
Hemoglobin: 11.2 g/dL — ABNORMAL LOW (ref 12.0–15.0)
Hemoglobin: 10.9 g/dL — ABNORMAL LOW (ref 12.0–15.0)
O2 Saturation: 100 %
O2 Saturation: 100 %
O2 Saturation: 100 %
O2 Saturation: 97 %
O2 Saturation: 98 %
Patient temperature: 35.5
Patient temperature: 100
Potassium: 4.1 mmol/L (ref 3.5–5.1)
Potassium: 4.1 mmol/L (ref 3.5–5.1)
Potassium: 4.5 mmol/L (ref 3.5–5.1)
Potassium: 3.3 mmol/L — ABNORMAL LOW (ref 3.5–5.1)
Potassium: 4.5 mmol/L (ref 3.5–5.1)
Sodium: 138 mmol/L (ref 135–145)
Sodium: 138 mmol/L (ref 135–145)
Sodium: 136 mmol/L (ref 135–145)
Sodium: 139 mmol/L (ref 135–145)
Sodium: 139 mmol/L (ref 135–145)
TCO2: 28 mmol/L (ref 22–32)
TCO2: 24 mmol/L (ref 22–32)
TCO2: 26 mmol/L (ref 22–32)
TCO2: 23 mmol/L (ref 22–32)
TCO2: 22 mmol/L (ref 22–32)
pCO2 arterial: 44.6 mmHg (ref 32–48)
pCO2 arterial: 39.4 mmHg (ref 32–48)
pCO2 arterial: 34.6 mmHg (ref 32–48)
pCO2 arterial: 34.5 mmHg (ref 32–48)
pCO2 arterial: 38.8 mmHg (ref 32–48)
pH, Arterial: 7.379 (ref 7.35–7.45)
pH, Arterial: 7.379 (ref 7.35–7.45)
pH, Arterial: 7.465 — ABNORMAL HIGH (ref 7.35–7.45)
pH, Arterial: 7.4 (ref 7.35–7.45)
pH, Arterial: 7.337 — ABNORMAL LOW (ref 7.35–7.45)
pO2, Arterial: 492 mmHg — ABNORMAL HIGH (ref 83–108)
pO2, Arterial: 268 mmHg — ABNORMAL HIGH (ref 83–108)
pO2, Arterial: 254 mmHg — ABNORMAL HIGH (ref 83–108)
pO2, Arterial: 82 mmHg — ABNORMAL LOW (ref 83–108)
pO2, Arterial: 120 mmHg — ABNORMAL HIGH (ref 83–108)

## 2024-06-07 LAB — POCT I-STAT, CHEM 8
BUN: 20 mg/dL (ref 8–23)
BUN: 21 mg/dL (ref 8–23)
BUN: 16 mg/dL (ref 8–23)
BUN: 16 mg/dL (ref 8–23)
BUN: 18 mg/dL (ref 8–23)
Calcium, Ion: 1.22 mmol/L (ref 1.15–1.40)
Calcium, Ion: 1.16 mmol/L (ref 1.15–1.40)
Calcium, Ion: 1.06 mmol/L — ABNORMAL LOW (ref 1.15–1.40)
Calcium, Ion: 1.09 mmol/L — ABNORMAL LOW (ref 1.15–1.40)
Calcium, Ion: 1.02 mmol/L — ABNORMAL LOW (ref 1.15–1.40)
Chloride: 103 mmol/L (ref 98–111)
Chloride: 103 mmol/L (ref 98–111)
Chloride: 102 mmol/L (ref 98–111)
Chloride: 102 mmol/L (ref 98–111)
Chloride: 101 mmol/L (ref 98–111)
Creatinine, Ser: 0.6 mg/dL (ref 0.44–1.00)
Creatinine, Ser: 0.6 mg/dL (ref 0.44–1.00)
Creatinine, Ser: 0.5 mg/dL (ref 0.44–1.00)
Creatinine, Ser: 0.6 mg/dL (ref 0.44–1.00)
Creatinine, Ser: 0.5 mg/dL (ref 0.44–1.00)
Glucose, Bld: 104 mg/dL — ABNORMAL HIGH (ref 70–99)
Glucose, Bld: 118 mg/dL — ABNORMAL HIGH (ref 70–99)
Glucose, Bld: 134 mg/dL — ABNORMAL HIGH (ref 70–99)
Glucose, Bld: 174 mg/dL — ABNORMAL HIGH (ref 70–99)
Glucose, Bld: 206 mg/dL — ABNORMAL HIGH (ref 70–99)
HCT: 37 % (ref 36.0–46.0)
HCT: 37 % (ref 36.0–46.0)
HCT: 24 % — ABNORMAL LOW (ref 36.0–46.0)
HCT: 26 % — ABNORMAL LOW (ref 36.0–46.0)
HCT: 25 % — ABNORMAL LOW (ref 36.0–46.0)
Hemoglobin: 12.6 g/dL (ref 12.0–15.0)
Hemoglobin: 12.6 g/dL (ref 12.0–15.0)
Hemoglobin: 8.2 g/dL — ABNORMAL LOW (ref 12.0–15.0)
Hemoglobin: 8.8 g/dL — ABNORMAL LOW (ref 12.0–15.0)
Hemoglobin: 8.5 g/dL — ABNORMAL LOW (ref 12.0–15.0)
Potassium: 4.2 mmol/L (ref 3.5–5.1)
Potassium: 4 mmol/L (ref 3.5–5.1)
Potassium: 4.1 mmol/L (ref 3.5–5.1)
Potassium: 4.6 mmol/L (ref 3.5–5.1)
Potassium: 4.5 mmol/L (ref 3.5–5.1)
Sodium: 138 mmol/L (ref 135–145)
Sodium: 137 mmol/L (ref 135–145)
Sodium: 136 mmol/L (ref 135–145)
Sodium: 137 mmol/L (ref 135–145)
Sodium: 135 mmol/L (ref 135–145)
TCO2: 29 mmol/L (ref 22–32)
TCO2: 26 mmol/L (ref 22–32)
TCO2: 26 mmol/L (ref 22–32)
TCO2: 26 mmol/L (ref 22–32)
TCO2: 26 mmol/L (ref 22–32)

## 2024-06-07 LAB — BASIC METABOLIC PANEL WITH GFR
Anion gap: 6 (ref 5–15)
BUN: 11 mg/dL (ref 8–23)
CO2: 23 mmol/L (ref 22–32)
Calcium: 7.5 mg/dL — ABNORMAL LOW (ref 8.9–10.3)
Chloride: 108 mmol/L (ref 98–111)
Creatinine, Ser: 0.51 mg/dL (ref 0.44–1.00)
GFR, Estimated: >60 mL/min (ref >60)
Glucose, Bld: 116 mg/dL — ABNORMAL HIGH (ref 70–99)
Potassium: 4.9 mmol/L (ref 3.5–5.1)
Sodium: 137 mmol/L (ref 135–145)

## 2024-06-07 LAB — GLUCOSE, CAPILLARY
Glucose-Capillary: 131 mg/dL — ABNORMAL HIGH (ref 70–99)
Glucose-Capillary: 125 mg/dL — ABNORMAL HIGH (ref 70–99)
Glucose-Capillary: 99 mg/dL (ref 70–99)
Glucose-Capillary: 140 mg/dL — ABNORMAL HIGH (ref 70–99)
Glucose-Capillary: 128 mg/dL — ABNORMAL HIGH (ref 70–99)
Glucose-Capillary: 125 mg/dL — ABNORMAL HIGH (ref 70–99)
Glucose-Capillary: 156 mg/dL — ABNORMAL HIGH (ref 70–99)
Glucose-Capillary: 157 mg/dL — ABNORMAL HIGH (ref 70–99)
Glucose-Capillary: 125 mg/dL — ABNORMAL HIGH (ref 70–99)
Glucose-Capillary: 98 mg/dL (ref 70–99)

## 2024-06-07 LAB — MAGNESIUM: Magnesium: 3.4 mg/dL — ABNORMAL HIGH (ref 1.7–2.4)

## 2024-06-07 LAB — CBC
HCT: 34.7 % — ABNORMAL LOW (ref 36.0–46.0)
HCT: 34.7 % — ABNORMAL LOW (ref 36.0–46.0)
Hemoglobin: 11.4 g/dL — ABNORMAL LOW (ref 12.0–15.0)
Hemoglobin: 11.3 g/dL — ABNORMAL LOW (ref 12.0–15.0)
MCH: 29.6 pg (ref 26.0–34.0)
MCH: 29.7 pg (ref 26.0–34.0)
MCHC: 32.9 g/dL (ref 30.0–36.0)
MCHC: 32.6 g/dL (ref 30.0–36.0)
MCV: 90.1 fL (ref 80.0–100.0)
MCV: 91.1 fL (ref 80.0–100.0)
Platelets: 215 10*3/uL (ref 150–400)
Platelets: 266 10*3/uL (ref 150–400)
RBC: 3.85 MIL/uL — ABNORMAL LOW (ref 3.87–5.11)
RBC: 3.81 MIL/uL — ABNORMAL LOW (ref 3.87–5.11)
RDW: 15.3 % (ref 11.5–15.5)
RDW: 15.2 % (ref 11.5–15.5)
WBC: 13.9 10*3/uL — ABNORMAL HIGH (ref 4.0–10.5)
WBC: 18.5 10*3/uL — ABNORMAL HIGH (ref 4.0–10.5)
nRBC: 0 % (ref 0.0–0.2)
nRBC: 0 % (ref 0.0–0.2)

## 2024-06-07 LAB — HEMOGLOBIN AND HEMATOCRIT, BLOOD
HCT: 27.5 % — ABNORMAL LOW (ref 36.0–46.0)
Hemoglobin: 8.9 g/dL — ABNORMAL LOW (ref 12.0–15.0)

## 2024-06-07 LAB — APTT: aPTT: 32 s (ref 24–36)

## 2024-06-07 LAB — PROTIME-INR
INR: 1.4 — ABNORMAL HIGH (ref 0.8–1.2)
Prothrombin Time: 17.3 s — ABNORMAL HIGH (ref 11.4–15.2)

## 2024-06-07 LAB — ABO/RH: ABO/RH(D): O NEG

## 2024-06-07 LAB — PLATELET COUNT: Platelets: 229 10*3/uL (ref 150–400)

## 2024-06-07 LAB — ECHO INTRAOPERATIVE TEE: Height: 64 in

## 2024-06-07 SURGERY — REPLACEMENT, AORTIC VALVE, OPEN
Anesthesia: General | Site: Chest

## 2024-06-07 MED ORDER — CHLORHEXIDINE GLUCONATE 0.12 % MT SOLN
15.0000 mL | OROMUCOSAL | Status: AC
  Administered 2024-06-07: 15 mL via OROMUCOSAL
  Filled 2024-06-07: qty 15

## 2024-06-07 MED ORDER — METOPROLOL TARTRATE 5 MG/5ML IV SOLN: 2.5000 mg | INTRAVENOUS | Status: DC | PRN

## 2024-06-07 MED ORDER — DEXMEDETOMIDINE HCL IN NACL 400 MCG/100ML IV SOLN: 0.0000 ug/kg/h | INTRAVENOUS | Status: DC

## 2024-06-07 MED ORDER — POTASSIUM CHLORIDE 10 MEQ/50ML IV SOLN
10.0000 meq | INTRAVENOUS | Status: AC
  Administered 2024-06-07 (×3): 10 meq via INTRAVENOUS
  Filled 2024-06-07: qty 50

## 2024-06-07 MED ORDER — ACETAMINOPHEN 160 MG/5ML PO SOLN
1000.0000 mg | Freq: Four times a day (QID) | ORAL | Status: AC
Start: 2024-06-08 — End: 2024-06-12

## 2024-06-07 MED ORDER — MAGNESIUM SULFATE 4 GM/100ML IV SOLN
4.0000 g | Freq: Once | INTRAVENOUS | Status: AC
  Administered 2024-06-07: 4 g via INTRAVENOUS
  Filled 2024-06-07: qty 100

## 2024-06-07 MED ORDER — SODIUM CHLORIDE 0.9 % IV SOLN: 250.0000 mL | INTRAVENOUS | Status: AC

## 2024-06-07 MED ORDER — ASPIRIN 81 MG PO CHEW
324.0000 mg | CHEWABLE_TABLET | Freq: Once | ORAL | Status: AC
  Administered 2024-06-07: 324 mg via ORAL
  Filled 2024-06-07: qty 4

## 2024-06-07 MED ORDER — SODIUM CHLORIDE 0.9% FLUSH
3.0000 mL | Freq: Two times a day (BID) | INTRAVENOUS | Status: DC
  Administered 2024-06-08 – 2024-06-10 (×5): 3 mL via INTRAVENOUS

## 2024-06-07 MED ORDER — FENTANYL CITRATE (PF) 250 MCG/5ML IJ SOLN
INTRAMUSCULAR | Status: DC | PRN
  Administered 2024-06-07: 50 ug via INTRAVENOUS
  Administered 2024-06-07 (×3): 100 ug via INTRAVENOUS
  Administered 2024-06-07 (×2): 50 ug via INTRAVENOUS
  Administered 2024-06-07: 200 ug via INTRAVENOUS

## 2024-06-07 MED ORDER — ONDANSETRON HCL 4 MG/2ML IJ SOLN
INTRAMUSCULAR | Status: DC | PRN
  Administered 2024-06-07: 4 mg via INTRAVENOUS

## 2024-06-07 MED ORDER — THROMBIN 20000 UNITS EX SOLR: OROMUCOSAL | Status: DC | PRN

## 2024-06-07 MED ORDER — CHLORHEXIDINE GLUCONATE CLOTH 2 % EX PADS
6.0000 | MEDICATED_PAD | Freq: Every day | CUTANEOUS | Status: DC
  Administered 2024-06-07 – 2024-06-11 (×5): 6 via TOPICAL

## 2024-06-07 MED ORDER — TRAZODONE HCL 150 MG PO TABS
150.0000 mg | ORAL_TABLET | Freq: Every evening | ORAL | Status: DC | PRN
  Administered 2024-06-08: 150 mg via ORAL
  Filled 2024-06-07 (×2): qty 1

## 2024-06-07 MED ORDER — MIDAZOLAM HCL (PF) 10 MG/2ML IJ SOLN
INTRAMUSCULAR | Status: AC
  Filled 2024-06-07: qty 2

## 2024-06-07 MED ORDER — LACTATED RINGERS IV SOLN: INTRAVENOUS | Status: AC

## 2024-06-07 MED ORDER — ORAL CARE MOUTH RINSE
15.0000 mL | Freq: Once | OROMUCOSAL | Status: AC
Start: 2024-06-07 — End: 2024-06-07

## 2024-06-07 MED ORDER — CHLORHEXIDINE GLUCONATE 4 % EX SOLN: 30.0000 mL | CUTANEOUS | Status: DC

## 2024-06-07 MED ORDER — POTASSIUM CHLORIDE 10 MEQ/50ML IV SOLN
10.0000 meq | INTRAVENOUS | Status: AC
  Administered 2024-06-07 (×2): 10 meq via INTRAVENOUS
  Filled 2024-06-07: qty 50

## 2024-06-07 MED ORDER — ROCURONIUM BROMIDE 10 MG/ML (PF) SYRINGE
PREFILLED_SYRINGE | INTRAVENOUS | Status: DC | PRN
  Administered 2024-06-07 (×2): 50 mg via INTRAVENOUS
  Administered 2024-06-07: 30 mg via INTRAVENOUS

## 2024-06-07 MED ORDER — ALBUMIN HUMAN 5 % IV SOLN
250.0000 mL | INTRAVENOUS | Status: DC | PRN
  Administered 2024-06-07 – 2024-06-08 (×4): 12.5 g via INTRAVENOUS
  Filled 2024-06-07 (×2): qty 250

## 2024-06-07 MED ORDER — SODIUM CHLORIDE 0.45 % IV SOLN: INTRAVENOUS | Status: AC | PRN

## 2024-06-07 MED ORDER — PHENYLEPHRINE HCL-NACL 20-0.9 MG/250ML-% IV SOLN
0.0000 ug/min | INTRAVENOUS | Status: DC
  Administered 2024-06-08: 40 ug/min via INTRAVENOUS
  Filled 2024-06-07: qty 250

## 2024-06-07 MED ORDER — THROMBIN (RECOMBINANT) 20000 UNITS EX SOLR
CUTANEOUS | Status: AC
Start: 2024-06-07 — End: 2024-06-07
  Filled 2024-06-07: qty 20000

## 2024-06-07 MED ORDER — HEPARIN SODIUM (PORCINE) 1000 UNIT/ML IJ SOLN
INTRAMUSCULAR | Status: DC | PRN
  Administered 2024-06-07: 24000 [IU] via INTRAVENOUS

## 2024-06-07 MED ORDER — TRANEXAMIC ACID 1000 MG/10ML IV SOLN
INTRAVENOUS | Status: DC | PRN
  Administered 2024-06-07: 1.5 mg/kg/h via INTRAVENOUS

## 2024-06-07 MED ORDER — METOPROLOL TARTRATE 25 MG/10 ML ORAL SUSPENSION: 12.5000 mg | Freq: Two times a day (BID) | ORAL | Status: DC

## 2024-06-07 MED ORDER — PROPOFOL 10 MG/ML IV BOLUS
INTRAVENOUS | Status: DC | PRN
  Administered 2024-06-07: 50 mg via INTRAVENOUS

## 2024-06-07 MED ORDER — PROTAMINE SULFATE 10 MG/ML IV SOLN
INTRAVENOUS | Status: DC | PRN
  Administered 2024-06-07: 200 mg via INTRAVENOUS

## 2024-06-07 MED ORDER — ACETAMINOPHEN 160 MG/5ML PO SOLN
650.0000 mg | Freq: Once | ORAL | Status: AC
  Administered 2024-06-07: 650 mg
  Filled 2024-06-07: qty 20.3

## 2024-06-07 MED ORDER — PHENYLEPHRINE 80 MCG/ML (10ML) SYRINGE FOR IV PUSH (FOR BLOOD PRESSURE SUPPORT)
PREFILLED_SYRINGE | INTRAVENOUS | Status: AC
  Filled 2024-06-07: qty 10

## 2024-06-07 MED ORDER — FENTANYL CITRATE (PF) 250 MCG/5ML IJ SOLN
INTRAMUSCULAR | Status: AC
  Filled 2024-06-07: qty 5

## 2024-06-07 MED ORDER — LACTATED RINGERS IV SOLN: INTRAVENOUS | Status: DC | PRN

## 2024-06-07 MED ORDER — MIDAZOLAM HCL (PF) 5 MG/ML IJ SOLN
INTRAMUSCULAR | Status: DC | PRN
  Administered 2024-06-07: 1 mg via INTRAVENOUS
  Administered 2024-06-07: 4 mg via INTRAVENOUS
  Administered 2024-06-07 (×2): 2 mg via INTRAVENOUS
  Administered 2024-06-07: 1 mg via INTRAVENOUS

## 2024-06-07 MED ORDER — PANTOPRAZOLE SODIUM 40 MG PO TBEC
40.0000 mg | DELAYED_RELEASE_TABLET | Freq: Every day | ORAL | Status: DC
  Administered 2024-06-09 – 2024-06-12 (×4): 40 mg via ORAL
  Filled 2024-06-07 (×4): qty 1

## 2024-06-07 MED ORDER — EPHEDRINE 5 MG/ML INJ
INTRAVENOUS | Status: AC
  Filled 2024-06-07: qty 5

## 2024-06-07 MED ORDER — METOCLOPRAMIDE HCL 5 MG/ML IJ SOLN
10.0000 mg | Freq: Four times a day (QID) | INTRAMUSCULAR | Status: AC
Start: 2024-06-07 — End: 2024-06-08
  Administered 2024-06-07 – 2024-06-08 (×6): 10 mg via INTRAVENOUS
  Filled 2024-06-07 (×5): qty 2

## 2024-06-07 MED ORDER — BISACODYL 5 MG PO TBEC
10.0000 mg | DELAYED_RELEASE_TABLET | Freq: Every day | ORAL | Status: DC
  Administered 2024-06-08 – 2024-06-12 (×4): 10 mg via ORAL
  Filled 2024-06-07 (×4): qty 2

## 2024-06-07 MED ORDER — OXYCODONE HCL 5 MG PO TABS
5.0000 mg | ORAL_TABLET | ORAL | Status: DC | PRN
  Administered 2024-06-07 – 2024-06-10 (×8): 5 mg via ORAL
  Filled 2024-06-07 (×8): qty 1

## 2024-06-07 MED ORDER — CHLORHEXIDINE GLUCONATE 0.12 % MT SOLN
15.0000 mL | Freq: Once | OROMUCOSAL | Status: AC
  Filled 2024-06-07: qty 15

## 2024-06-07 MED ORDER — INSULIN REGULAR(HUMAN) IN NACL 100-0.9 UT/100ML-% IV SOLN: INTRAVENOUS | Status: DC

## 2024-06-07 MED ORDER — MORPHINE SULFATE (PF) 2 MG/ML IV SOLN
1.0000 mg | INTRAVENOUS | Status: DC | PRN
  Administered 2024-06-07 – 2024-06-08 (×2): 2 mg via INTRAVENOUS
  Filled 2024-06-07 (×3): qty 1

## 2024-06-07 MED ORDER — PROPOFOL 500 MG/50ML IV EMUL
INTRAVENOUS | Status: DC | PRN
Start: 2024-06-07 — End: 2024-06-07
  Administered 2024-06-07: 50 ug/kg/min via INTRAVENOUS

## 2024-06-07 MED ORDER — PRAMIPEXOLE DIHYDROCHLORIDE 1 MG PO TABS
2.2500 mg | ORAL_TABLET | Freq: Every day | ORAL | Status: DC
  Administered 2024-06-07 – 2024-06-11 (×5): 2.25 mg via ORAL
  Filled 2024-06-07 (×6): qty 1

## 2024-06-07 MED ORDER — EPHEDRINE SULFATE-NACL 50-0.9 MG/10ML-% IV SOSY
PREFILLED_SYRINGE | INTRAVENOUS | Status: DC | PRN
  Administered 2024-06-07 (×2): 5 mg via INTRAVENOUS

## 2024-06-07 MED ORDER — VANCOMYCIN HCL IN DEXTROSE 1-5 GM/200ML-% IV SOLN
1000.0000 mg | Freq: Once | INTRAVENOUS | Status: AC
  Administered 2024-06-07: 1000 mg via INTRAVENOUS
  Filled 2024-06-07: qty 200

## 2024-06-07 MED ORDER — PROPOFOL 10 MG/ML IV BOLUS
INTRAVENOUS | Status: AC
Start: 2024-06-07 — End: 2024-06-07
  Filled 2024-06-07: qty 20

## 2024-06-07 MED ORDER — SODIUM CHLORIDE 0.9 % IV SOLN: INTRAVENOUS | Status: AC

## 2024-06-07 MED ORDER — HEMOSTATIC AGENTS (NO CHARGE) OPTIME
TOPICAL | Status: DC | PRN
  Administered 2024-06-07: 1 via TOPICAL

## 2024-06-07 MED ORDER — ACETAMINOPHEN 500 MG PO TABS
1000.0000 mg | ORAL_TABLET | Freq: Four times a day (QID) | ORAL | Status: DC
Start: 2024-06-08 — End: 2024-06-12
  Administered 2024-06-07 – 2024-06-12 (×18): 1000 mg via ORAL
  Filled 2024-06-07 (×17): qty 2

## 2024-06-07 MED ORDER — ONDANSETRON HCL 4 MG/2ML IJ SOLN: 4.0000 mg | Freq: Four times a day (QID) | INTRAMUSCULAR | Status: DC | PRN

## 2024-06-07 MED ORDER — PAROXETINE HCL 20 MG PO TABS
20.0000 mg | ORAL_TABLET | Freq: Every day | ORAL | Status: DC
  Administered 2024-06-08 – 2024-06-12 (×5): 20 mg via ORAL
  Filled 2024-06-07 (×5): qty 1

## 2024-06-07 MED ORDER — DEXTROSE 50 % IV SOLN: 0.0000 mL | INTRAVENOUS | Status: DC | PRN

## 2024-06-07 MED ORDER — HEPARIN SODIUM (PORCINE) 1000 UNIT/ML IJ SOLN
INTRAMUSCULAR | Status: AC
Start: 2024-06-07 — End: 2024-06-07
  Filled 2024-06-07: qty 1

## 2024-06-07 MED ORDER — SODIUM CHLORIDE 0.9% FLUSH
3.0000 mL | INTRAVENOUS | Status: AC | PRN
Start: 2024-06-08 — End: ?

## 2024-06-07 MED ORDER — METOPROLOL TARTRATE 12.5 MG HALF TABLET: 12.5000 mg | ORAL_TABLET | Freq: Once | ORAL | Status: DC

## 2024-06-07 MED ORDER — ASPIRIN 325 MG PO TBEC
325.0000 mg | DELAYED_RELEASE_TABLET | Freq: Every day | ORAL | Status: DC
  Administered 2024-06-08 – 2024-06-12 (×5): 325 mg via ORAL
  Filled 2024-06-07 (×5): qty 1

## 2024-06-07 MED ORDER — SODIUM CHLORIDE 0.9 % IV SOLN: INTRAVENOUS | Status: DC | PRN

## 2024-06-07 MED ORDER — PROTAMINE SULFATE 10 MG/ML IV SOLN
INTRAVENOUS | Status: AC
  Filled 2024-06-07: qty 25

## 2024-06-07 MED ORDER — DOCUSATE SODIUM 100 MG PO CAPS
200.0000 mg | ORAL_CAPSULE | Freq: Every day | ORAL | Status: DC
  Administered 2024-06-08 – 2024-06-12 (×4): 200 mg via ORAL
  Filled 2024-06-07 (×4): qty 2

## 2024-06-07 MED ORDER — PHENYLEPHRINE 80 MCG/ML (10ML) SYRINGE FOR IV PUSH (FOR BLOOD PRESSURE SUPPORT)
PREFILLED_SYRINGE | INTRAVENOUS | Status: DC | PRN
  Administered 2024-06-07: 160 ug via INTRAVENOUS

## 2024-06-07 MED ORDER — ROCURONIUM BROMIDE 10 MG/ML (PF) SYRINGE
PREFILLED_SYRINGE | INTRAVENOUS | Status: AC
  Filled 2024-06-07: qty 10

## 2024-06-07 MED ORDER — MIDAZOLAM HCL 2 MG/2ML IJ SOLN: 2.0000 mg | INTRAMUSCULAR | Status: DC | PRN

## 2024-06-07 MED ORDER — PANTOPRAZOLE SODIUM 40 MG IV SOLR
40.0000 mg | Freq: Every day | INTRAVENOUS | Status: AC
  Administered 2024-06-07 – 2024-06-08 (×2): 40 mg via INTRAVENOUS
  Filled 2024-06-07 (×2): qty 10

## 2024-06-07 MED ORDER — 0.9 % SODIUM CHLORIDE (POUR BTL) OPTIME
TOPICAL | Status: DC | PRN
  Administered 2024-06-07: 5000 mL

## 2024-06-07 MED ORDER — ASPIRIN 81 MG PO CHEW
324.0000 mg | CHEWABLE_TABLET | Freq: Every day | ORAL | Status: DC
  Filled 2024-06-07: qty 4

## 2024-06-07 MED ORDER — BISACODYL 10 MG RE SUPP: 10.0000 mg | Freq: Every day | RECTAL | Status: DC

## 2024-06-07 MED ORDER — ~~LOC~~ CARDIAC SURGERY, PATIENT & FAMILY EDUCATION
Freq: Once | Status: DC
  Filled 2024-06-07: qty 1

## 2024-06-07 MED ORDER — CHLORHEXIDINE GLUCONATE 0.12 % MT SOLN
15.0000 mL | Freq: Once | OROMUCOSAL | Status: AC
  Administered 2024-06-07: 15 mL via OROMUCOSAL

## 2024-06-07 MED ORDER — METOPROLOL TARTRATE 12.5 MG HALF TABLET: 12.5000 mg | ORAL_TABLET | Freq: Two times a day (BID) | ORAL | Status: DC

## 2024-06-07 MED ORDER — CEFAZOLIN SODIUM-DEXTROSE 2-4 GM/100ML-% IV SOLN
2.0000 g | Freq: Three times a day (TID) | INTRAVENOUS | Status: AC
  Administered 2024-06-07 – 2024-06-09 (×6): 2 g via INTRAVENOUS
  Filled 2024-06-07 (×6): qty 100

## 2024-06-07 MED ORDER — LACTATED RINGERS IV SOLN: INTRAVENOUS | Status: DC

## 2024-06-07 MED ORDER — ALBUMIN HUMAN 5 % IV SOLN: INTRAVENOUS | Status: DC | PRN

## 2024-06-07 MED ORDER — TRAMADOL HCL 50 MG PO TABS
50.0000 mg | ORAL_TABLET | ORAL | Status: DC | PRN
  Administered 2024-06-07: 50 mg via ORAL
  Filled 2024-06-07: qty 1

## 2024-06-07 SURGICAL SUPPLY — 61 items
ADAPTER CARDIO PERF ANTE/RETRO (ADAPTER) ×2 IMPLANT
BAG DECANTER FOR FLEXI CONT (MISCELLANEOUS) ×2 IMPLANT
BLADE CLIPPER SURG (BLADE) ×2 IMPLANT
BLADE STERNUM SYSTEM 6 (BLADE) ×2 IMPLANT
BLADE SURG 15 STRL LF DISP TIS (BLADE) ×2 IMPLANT
CANISTER SUCTION 3000ML PPV (SUCTIONS) ×2 IMPLANT
CANNULA AORTIC ROOT 9FR (CANNULA) ×2 IMPLANT
CANNULA ARTERIAL NVNT 3/8 20FR (MISCELLANEOUS) IMPLANT
CANNULA GUNDRY RCSP 15FR (MISCELLANEOUS) ×2 IMPLANT
CANNULA MC2 2 STG 36/46 NON-V (CANNULA) IMPLANT
CATH HEART VENT LEFT (CATHETERS) ×2 IMPLANT
CATH ROBINSON RED A/P 18FR (CATHETERS) ×6 IMPLANT
CATH THORACIC 36FR (CATHETERS) ×2 IMPLANT
CATH THORACIC 36FR RT ANG (CATHETERS) ×2 IMPLANT
CNTNR URN SCR LID CUP LEK RST (MISCELLANEOUS) ×2 IMPLANT
CONTAINER PROTECT SURGISLUSH (MISCELLANEOUS) ×4 IMPLANT
COVER SURGICAL LIGHT HANDLE (MISCELLANEOUS) ×2 IMPLANT
DEVICE SUT CK QUICK LOAD INDV (Prosthesis & Implant Heart) IMPLANT
DEVICE SUT CK QUICK LOAD MINI (Prosthesis & Implant Heart) IMPLANT
DRAPE SRG 135X102X78XABS (DRAPES) ×2 IMPLANT
DRAPE WARM FLUID 44X44 (DRAPES) ×2 IMPLANT
DRSG COVADERM 4X14 (GAUZE/BANDAGES/DRESSINGS) ×2 IMPLANT
ELECT CAUTERY BLADE 6.4 (BLADE) ×2 IMPLANT
ELECTRODE REM PT RTRN 9FT ADLT (ELECTROSURGICAL) ×4 IMPLANT
FELT TEFLON 1X6 (MISCELLANEOUS) ×4 IMPLANT
GAUZE SPONGE 4X4 12PLY STRL (GAUZE/BANDAGES/DRESSINGS) ×2 IMPLANT
GLOVE SURG MICRO LTX SZ7 (GLOVE) ×4 IMPLANT
GOWN STRL REUS W/ TWL LRG LVL3 (GOWN DISPOSABLE) ×8 IMPLANT
GOWN STRL REUS W/ TWL XL LVL3 (GOWN DISPOSABLE) ×2 IMPLANT
HEMOSTAT POWDER SURGIFOAM 1G (HEMOSTASIS) ×6 IMPLANT
HEMOSTAT SURGICEL 2X14 (HEMOSTASIS) ×2 IMPLANT
KIT BASIN OR (CUSTOM PROCEDURE TRAY) ×2 IMPLANT
KIT SUCTION CATH 14FR (SUCTIONS) IMPLANT
KIT SUT CK MINI COMBO 4X17 (Prosthesis & Implant Heart) IMPLANT
KIT TURNOVER KIT B (KITS) ×2 IMPLANT
LINE VENT (MISCELLANEOUS) IMPLANT
NS IRRIG 1000ML POUR BTL (IV SOLUTION) ×12 IMPLANT
PACK E OPEN HEART (SUTURE) ×2 IMPLANT
PACK OPEN HEART (CUSTOM PROCEDURE TRAY) ×2 IMPLANT
PAD ARMBOARD POSITIONER FOAM (MISCELLANEOUS) ×4 IMPLANT
PENCIL BUTTON HOLSTER BLD 10FT (ELECTRODE) ×2 IMPLANT
POSITIONER HEAD DONUT 9IN (MISCELLANEOUS) ×2 IMPLANT
SET MPS 3-ND DEL (MISCELLANEOUS) IMPLANT
SUT BONE WAX W31G (SUTURE) ×2 IMPLANT
SUT EB EXC GRN/WHT 2-0 V-5 (SUTURE) ×4 IMPLANT
SUT ETHIBON EXCEL 2-0 V-5 (SUTURE) IMPLANT
SUT ETHIBOND V-5 VALVE (SUTURE) IMPLANT
SUT PROLENE 3 0 SH DA (SUTURE) IMPLANT
SUT PROLENE 3 0 SH1 36 (SUTURE) ×2 IMPLANT
SUT PROLENE 4-0 RB1 .5 CRCL 36 (SUTURE) ×6 IMPLANT
SUT STEEL 6MS V (SUTURE) IMPLANT
SUT VIC AB 1 CTX36XBRD ANBCTR (SUTURE) ×4 IMPLANT
SYSTEM SAHARA CHEST DRAIN ATS (WOUND CARE) ×2 IMPLANT
TAPE CLOTH SURG 4X10 WHT LF (GAUZE/BANDAGES/DRESSINGS) IMPLANT
TAPE PAPER 2X10 WHT MICROPORE (GAUZE/BANDAGES/DRESSINGS) IMPLANT
TOWEL GREEN STERILE (TOWEL DISPOSABLE) ×2 IMPLANT
TOWEL GREEN STERILE FF (TOWEL DISPOSABLE) ×2 IMPLANT
TUBE SUCT INTRACARD DLP 20F (MISCELLANEOUS) ×2 IMPLANT
UNDERPAD 30X36 HEAVY ABSORB (UNDERPADS AND DIAPERS) ×2 IMPLANT
VALVE AORTIC SZ23 INSP/RESIL (Prosthesis & Implant Heart) IMPLANT
WATER STERILE IRR 1000ML POUR (IV SOLUTION) ×4 IMPLANT

## 2024-06-07 NOTE — Progress Notes (Addendum)
 [facility] Radiology reading room to request chest x ray reading. The tech stated he would notify the reading room.   0700- Dr. Sherene Dilling made aware that the chest-xray is still pending. Ok to proceed without waiting on results per Dr. Sherene Dilling.

## 2024-06-07 NOTE — Anesthesia Preprocedure Evaluation (Signed)
 Anesthesia Evaluation  Patient identified by MRN, date of birth, ID band Patient awake    Reviewed: Allergy & Precautions, NPO status , Patient's Chart, lab work & pertinent test results  Airway Mallampati: II  TM Distance: >3 FB Neck ROM: Full    Dental  (+) Dental Advisory Given   Pulmonary neg pulmonary ROS   breath sounds clear to auscultation       Cardiovascular hypertension, Pt. on medications  Rhythm:Regular Rate:Normal     Neuro/Psych negative neurological ROS     GI/Hepatic negative GI ROS, Neg liver ROS,,,  Endo/Other  negative endocrine ROS    Renal/GU negative Renal ROS     Musculoskeletal  (+) Arthritis ,    Abdominal   Peds  Hematology negative hematology ROS (+)   Anesthesia Other Findings   Reproductive/Obstetrics                             Anesthesia Physical Anesthesia Plan  ASA: 4  Anesthesia Plan: General   Post-op Pain Management:    Induction: Intravenous  PONV Risk Score and Plan: 3 and Treatment may vary due to age or medical condition  Airway Management Planned: Oral ETT  Additional Equipment: Arterial line, CVP, PA Cath, TEE and Ultrasound Guidance Line Placement  Intra-op Plan:   Post-operative Plan: Post-operative intubation/ventilation  Informed Consent: I have reviewed the patients History and Physical, chart, labs and discussed the procedure including the risks, benefits and alternatives for the proposed anesthesia with the patient or authorized representative who has indicated his/her understanding and acceptance.     Dental advisory given  Plan Discussed with: CRNA  Anesthesia Plan Comments:        Anesthesia Quick Evaluation

## 2024-06-07 NOTE — Brief Op Note (Signed)
 06/07/2024  11:31 AM  PATIENT:  Charlene Barnes  78 y.o. female  PRE-OPERATIVE DIAGNOSIS:  SEVERE AORTIC INSUFFICIENCY  POST-OPERATIVE DIAGNOSIS:  SEVERE AORTIC INSUFFICIENCY  PROCEDURE:  Procedure(s): AORTIC VALVE REPLACEMENT USING INSPIRIS RESILIA AORTIC VALVE SIZE 23 MM (N/A) ECHOCARDIOGRAM, TRANSESOPHAGEAL, INTRAOPERATIVE (N/A)  SURGEON:  Surgeons and Role:    * Bartley Lightning, MD - Primary  PHYSICIAN ASSISTANT: Matt Song PA-C   ANESTHESIA:   general  EBL:  1125 mL   BLOOD ADMINISTERED:none  DRAINS: 2  Chest Tube(s) in the mediastinum   LOCAL MEDICATIONS USED:  NONE  SPECIMEN:  Source of Specimen:  aortic valve  DISPOSITION OF SPECIMEN:  PATHOLOGY  COUNTS:  YES  TOURNIQUET:  * No tourniquets in log *  DICTATION: .Note written in EPIC  PLAN OF CARE: Admit to inpatient   PATIENT DISPOSITION:  ICU - intubated and hemodynamically stable.   Delay start of Pharmacological VTE agent (>24hrs) due to surgical blood loss or risk of bleeding: yes

## 2024-06-07 NOTE — Op Note (Signed)
 CARDIOVASCULAR SURGERY OPERATIVE NOTE  06/07/2024 Charlene Barnes [id number]  Surgeon:  Bartley Lightning, MD  First Assistant: Matt Song,  PA-C   Preoperative Diagnosis:  Severe aortic insufficiency   Postoperative Diagnosis:  Same   Procedure:  Median Sternotomy Extracorporeal circulation 3.   Aortic valve replacement using a 23 mm Edwards INSPIRIS RESILIA  pericardial valve.  Anesthesia:  General Endotracheal   Clinical History/Surgical Indication:  This 78 year old woman has stage D, moderate to severe symptomatic aortic insufficiency with NYHA class II symptoms of exertional fatigue and shortness of breath consistent with chronic diastolic congestive heart failure. She has mild left ventricular cavity dilation with low normal ejection fraction. Cardiac catheterization showed no coronary disease with normal left and right heart filling pressures. The ascending aorta appeared to be of normal caliber on her calcium scoring CT scan. I agree that the best treatment for her is aortic valve replacement to improve her symptoms and prevent progressive left ventricular dysfunction. Given her age I would recommend a bioprosthetic valve. I reviewed the TEE and catheterization images with the patient and her husband and answered all of their questions. I discussed the operative procedure with the patient and and her husband including alternatives, benefits and risks; including but not limited to bleeding, blood transfusion, infection, stroke, myocardial infarction, graft failure, heart block requiring a permanent pacemaker, organ dysfunction, and death. Charlene Barnes understands and agrees to proceed.   Preparation:  The patient was seen in the preoperative holding area and the correct patient, correct operation were confirmed with the patient after reviewing the medical record and catheterization. The consent was signed by me. Preoperative antibiotics were given. A pulmonary arterial line and  radial arterial line were placed by the anesthesia team. The patient was taken back to the operating room and positioned supine on the operating room table. After being placed under general endotracheal anesthesia by the anesthesia team a foley catheter was placed. The neck, chest, abdomen, and both legs were prepped with betadine soap and solution and draped in the usual sterile manner. A surgical time-out was taken and the correct patient and operative procedure were confirmed with the nursing and anesthesia staff.   Pre-bypass TEE:   Complete TEE assessment was performed by Dr. Peggy Bowens. This showed moderate AI with mild LV dilation and normal LV systolic function.    Post-bypass TEE:   Normal functioning prosthetic aortic valve with no perivalvular leak or regurgitation through the valve. Left ventricular function preserved. No mitral regurgitation.    Cardiopulmonary Bypass:  A median sternotomy was performed. The pericardium was opened in the midline. Right ventricular function appeared normal. The ascending aorta was of normal size and had no palpable plaque. There were no contraindications to aortic cannulation or cross-clamping. The patient was fully systemically heparinized and the ACT was maintained > 400 sec. The proximal aortic arch was cannulated with a 20 F aortic cannula for arterial inflow. Venous cannulation was performed via the right atrial appendage using a two-staged venous cannula. An antegrade cardioplegia/vent cannula was inserted into the mid-ascending aorta. A left ventricular vent was placed via the right superior pulmonary vein. A retrograde cardioplegia cannnula was placed into the coronary sinus via the right atrium. Aortic occlusion was performed with a single cross-clamp. Systemic cooling to 32 degrees Centigrade and topical cooling of the heart with iced saline were used. Hyperkalemic antegrade cold blood cardioplegia was used to induce diastolic arrest  and then cold blood retrograde cardioplegia was given at about  20 minute intervals throughout the period of arrest to maintain myocardial temperature at or below 10 degrees centigrade. A temperature probe was inserted into the interventricular septum and an insulating pad was placed in the pericardium. Carbon dioxide was insufflated into the pericardium at 5L/min throughout the procedure to minimize intracardiac air.   Aortic Valve Replacement:   A transverse aortotomy was performed 1 cm above the take-off of the right coronary artery. The native valve was tricuspid with thickened leaflets and no annular calcification. There were some fenestrations of the commissures. There was lack of central coaptation. The ostia of the coronary arteries were in normal position and were not obstructed. The native valve leaflets were excised. The left ventricle was directly inspected for debris and then irrigated with ice saline solution. The annulus was sized and a size 23 mm Edwards INSPIRIS RESILIA valve was chosen. The model number was [id number] and the serial number was [id number]. While the valve was being prepared 2-0 Ethibond pledgeted horizontal mattress sutures were placed around the annulus with the pledgets in a sub-annular position. The sutures were placed through the sewing ring and the valve lowered into place. The sutures were tied using CorKnots. The valve seated nicely and the coronary ostia were not obstructed. The prosthetic valve leaflets moved normally and there was no sub-valvular obstruction. The aortotomy was closed using 4-0 Prolene suture in 2 layers with felt strips to reinforce the closure.  Completion:  The patient was rewarmed to 37 degrees Centigrade. De-airing maneuvers were performed and the head placed in trendelenburg position. The crossclamp was removed with a time of 76 minutes. There was spontaneous return of sinus rhythm. The aortotomy was checked for hemostasis. Two temporary epicardial  pacing wires were placed on the right atrium and two on the right ventricle. The left ventricular vent and retrograde cardioplegia cannulas were removed. The patient was weaned from CPB without difficulty on no inotropes. CPB time was 96 minutes. Cardiac output was 5 LPM. Heparin  was fully reversed with protamine and the aortic and venous cannulas removed. Hemostasis was achieved. Mediastinal drainage tubes were placed. The sternum was closed with #6 stainless steel wires. The fascia was closed with continuous # 1 vicryl suture. The subcutaneous tissue was closed with 2-0 vicryl continuous suture. The skin was closed with 3-0 vicryl subcuticular suture. All sponge, needle, and instrument counts were reported correct at the end of the case. Dry sterile dressings were placed over the incisions and around the chest tubes which were connected to pleurevac suction. The patient was then transported to the surgical intensive care unit in stable condition.

## 2024-06-07 NOTE — Progress Notes (Signed)
 Patient ID: Charlene Barnes, female   DOB: 02/18/1946, 78 y.o.   MRN: [id number]   TCTS Evening Rounds:   Hemodynamically stable  CI = 1.96 AV paced at 80  Has started to wake up on vent. Following commands Urine output good  CT output low  CBC    Component Value Date/Time   WBC 13.9 (H) 06/07/2024 1147   RBC 3.85 (L) 06/07/2024 1147   HGB 11.4 (L) 06/07/2024 1147   HGB 13.4 03/20/2024 1631   HCT 34.7 (L) 06/07/2024 1147   HCT 40.1 03/20/2024 1631   PLT 215 06/07/2024 1147   PLT 333 03/20/2024 1631   MCV 90.1 06/07/2024 1147   MCV 90 03/20/2024 1631   MCH 29.6 06/07/2024 1147   MCHC 32.9 06/07/2024 1147   RDW 15.3 06/07/2024 1147   RDW 14.2 03/20/2024 1631   LYMPHSABS 2.5 03/20/2024 1631   EOSABS 0.1 03/20/2024 1631   BASOSABS 0.0 03/20/2024 1631     BMET    Component Value Date/Time   NA 136 06/07/2024 1051   NA 141 03/20/2024 1631   K 4.5 06/07/2024 1051   CL 101 06/07/2024 1046   CO2 29 06/06/2024 1200   GLUCOSE 206 (H) 06/07/2024 1046   BUN 18 06/07/2024 1046   BUN 14 03/20/2024 1631   CREATININE 0.50 06/07/2024 1046   CALCIUM 8.9 06/06/2024 1200   EGFR 91 03/20/2024 1631   GFRNONAA >60 06/06/2024 1200     A/P:  Stable postop course. Continue current plans. Extubate when ready.

## 2024-06-07 NOTE — Transfer of Care (Signed)
 Immediate Anesthesia Transfer of Care Note  Patient: Charlene Barnes  Procedure(s) Performed: AORTIC VALVE REPLACEMENT USING INSPIRIS RESILIA AORTIC VALVE SIZE 23 MM (Chest) ECHOCARDIOGRAM, TRANSESOPHAGEAL, INTRAOPERATIVE  Patient Location: SICU  Anesthesia Type:General  Level of Consciousness: sedated and Patient remains intubated per anesthesia plan  Airway & Oxygen  Therapy: Patient remains intubated per anesthesia plan and Patient placed on Ventilator (see vital sign flow sheet for setting)  Post-op Assessment: Report given to RN and Post -op Vital signs reviewed and stable  Post vital signs: Reviewed and stable  Last Vitals:  Vitals Value Taken Time  BP    Temp 35.4 C 06/07/24 12:01  Pulse 80 06/07/24 12:01  Resp 15 06/07/24 12:01  SpO2 97 % 06/07/24 12:01  Vitals shown include unfiled device data.  Last Pain:  Vitals:   06/07/24 0600  TempSrc:   PainSc: 0-No pain      Patients Stated Pain Goal: 0 (06/07/24 0600)  Complications: No notable events documented.

## 2024-06-07 NOTE — Anesthesia Procedure Notes (Signed)
 Central Venous Catheter Insertion Performed by: Peggy Bowens, MD, anesthesiologist Start/End6/19/2025 6:55 AM, 06/07/2024 7:10 AM Patient location: Pre-op. Preanesthetic checklist: patient identified, IV checked, site marked, risks and benefits discussed, surgical consent, monitors and equipment checked, pre-op evaluation, timeout performed and anesthesia consent Position: Trendelenburg Lidocaine  1% used for infiltration and patient sedated Hand hygiene performed , maximum sterile barriers used  and Seldinger technique used Catheter size: 8.5 Fr Total catheter length 10. Central line was placed.Sheath introducer Swan type:thermodilution PA Cath depth:45 Procedure performed using ultrasound guided technique. Ultrasound Notes:anatomy identified, needle tip was noted to be adjacent to the nerve/plexus identified, no ultrasound evidence of intravascular and/or intraneural injection and image(s) printed for medical record Attempts: 1 Following insertion, line sutured and dressing applied. Post procedure assessment: blood return through all ports, free fluid flow and no air  Patient tolerated the procedure well with no immediate complications.

## 2024-06-07 NOTE — Anesthesia Procedure Notes (Signed)
 Central Venous Catheter Insertion Performed by: Peggy Bowens, MD, anesthesiologist Start/End6/19/2025 6:55 AM, 06/07/2024 7:10 AM Patient location: Pre-op. Preanesthetic checklist: patient identified, IV checked, site marked, risks and benefits discussed, surgical consent, monitors and equipment checked, pre-op evaluation, timeout performed and anesthesia consent Hand hygiene performed  and maximum sterile barriers used  PA cath was placed.Swan type:thermodilution PA Cath depth:45 Procedure performed without using ultrasound guided technique. Attempts: 1 Patient tolerated the procedure well with no immediate complications.

## 2024-06-07 NOTE — Anesthesia Procedure Notes (Signed)
 Procedure Name: Intubation Date/Time: 06/07/2024 7:48 AM  Performed by: Viki Graver, CRNAPre-anesthesia Checklist: Patient identified, Emergency Drugs available, Suction available and Patient being monitored Patient Re-evaluated:Patient Re-evaluated prior to induction Oxygen  Delivery Method: Circle System Utilized Preoxygenation: Pre-oxygenation with 100% oxygen  Induction Type: IV induction Ventilation: Mask ventilation without difficulty Laryngoscope Size: Miller and 2 Grade View: Grade II Tube type: Oral Tube size: 8.0 mm Number of attempts: 1 Airway Equipment and Method: Stylet Placement Confirmation: ETT inserted through vocal cords under direct vision, positive ETCO2 and breath sounds checked- equal and bilateral Secured at: 22 cm Tube secured with: Tape Dental Injury: Teeth and Oropharynx as per pre-operative assessment

## 2024-06-07 NOTE — Interval H&P Note (Signed)
 History and Physical Interval Note:  06/07/2024 6:36 AM  Charlene Barnes  has presented today for surgery, with the diagnosis of SEVERE AI.  The various methods of treatment have been discussed with the patient and family. After consideration of risks, benefits and other options for treatment, the patient has consented to  Procedure(s): REPLACEMENT, AORTIC VALVE, OPEN (N/A) ECHOCARDIOGRAM, TRANSESOPHAGEAL, INTRAOPERATIVE (N/A) as a surgical intervention.  The patient's history has been reviewed, patient examined, no change in status, stable for surgery.  I have reviewed the patient's chart and labs.  Questions were answered to the patient's satisfaction.     Saarah Dewing K Abigal Choung

## 2024-06-07 NOTE — Procedures (Signed)
 Extubation Procedure Note  Patient Details:   Name: Charlene Barnes DOB: February 26, 1946 MRN: [id number]   Airway Documentation:    Vent end date: 06/07/24 Vent end time: 1815   Evaluation  O2 sats: stable throughout Complications: No apparent complications Patient did tolerate procedure well. Bilateral Breath Sounds: Clear, Diminished    Patient extubated per rapid wean protocol with RT x2 and RN at bedside. NIF -28, VC 1.4L with great effort. Cuff leak heard prior to extubation, no stridor noted post. Patient is on 2L [place] and tolerating well at this time.     Yes  Vitor Overbaugh Mirian Ames 06/07/2024, 6:20 PM

## 2024-06-07 NOTE — Anesthesia Procedure Notes (Signed)
 Arterial Line Insertion Start/End6/19/2025 7:04 AM, 06/07/2024 7:13 AM Performed by: Peggy Bowens, MD, Viki Graver, CRNA, CRNA  Patient location: Pre-op. Preanesthetic checklist: patient identified, IV checked, site marked, risks and benefits discussed, surgical consent, monitors and equipment checked, pre-op evaluation, timeout performed and anesthesia consent Lidocaine  1% used for infiltration radial was placed Catheter size: 20 G Hand hygiene performed  and maximum sterile barriers used   Attempts: 1 Procedure performed using ultrasound guided technique. Ultrasound Notes:anatomy identified, needle tip was noted to be adjacent to the nerve/plexus identified and no ultrasound evidence of intravascular and/or intraneural injection Following insertion, dressing applied. Post procedure assessment: normal and unchanged  Patient tolerated the procedure well with no immediate complications.

## 2024-06-08 ENCOUNTER — Encounter (HOSPITAL_COMMUNITY): Payer: Self-pay | Admitting: Surgery

## 2024-06-08 ENCOUNTER — Inpatient Hospital Stay (HOSPITAL_COMMUNITY)

## 2024-06-08 LAB — BASIC METABOLIC PANEL WITH GFR
Anion gap: 6 (ref 5–15)
Anion gap: 5 (ref 5–15)
BUN: 6 mg/dL — ABNORMAL LOW (ref 8–23)
BUN: 8 mg/dL (ref 8–23)
CO2: 23 mmol/L (ref 22–32)
CO2: 26 mmol/L (ref 22–32)
Calcium: 6.9 mg/dL — ABNORMAL LOW (ref 8.9–10.3)
Calcium: 7.4 mg/dL — ABNORMAL LOW (ref 8.9–10.3)
Chloride: 102 mmol/L (ref 98–111)
Chloride: 104 mmol/L (ref 98–111)
Creatinine, Ser: 0.51 mg/dL (ref 0.44–1.00)
Creatinine, Ser: 0.57 mg/dL (ref 0.44–1.00)
GFR, Estimated: >60 mL/min (ref >60)
GFR, Estimated: >60 mL/min (ref >60)
Glucose, Bld: 120 mg/dL — ABNORMAL HIGH (ref 70–99)
Glucose, Bld: 79 mg/dL (ref 70–99)
Potassium: 3.9 mmol/L (ref 3.5–5.1)
Potassium: 4.3 mmol/L (ref 3.5–5.1)
Sodium: 131 mmol/L — ABNORMAL LOW (ref 135–145)
Sodium: 135 mmol/L (ref 135–145)

## 2024-06-08 LAB — GLUCOSE, CAPILLARY
Glucose-Capillary: 103 mg/dL — ABNORMAL HIGH (ref 70–99)
Glucose-Capillary: 113 mg/dL — ABNORMAL HIGH (ref 70–99)
Glucose-Capillary: 113 mg/dL — ABNORMAL HIGH (ref 70–99)
Glucose-Capillary: 114 mg/dL — ABNORMAL HIGH (ref 70–99)
Glucose-Capillary: 117 mg/dL — ABNORMAL HIGH (ref 70–99)
Glucose-Capillary: 118 mg/dL — ABNORMAL HIGH (ref 70–99)
Glucose-Capillary: 120 mg/dL — ABNORMAL HIGH (ref 70–99)
Glucose-Capillary: 125 mg/dL — ABNORMAL HIGH (ref 70–99)
Glucose-Capillary: 182 mg/dL — ABNORMAL HIGH (ref 70–99)
Glucose-Capillary: 53 mg/dL — ABNORMAL LOW (ref 70–99)

## 2024-06-08 LAB — CBC
HCT: 32 % — ABNORMAL LOW (ref 36.0–46.0)
HCT: 29.1 % — ABNORMAL LOW (ref 36.0–46.0)
Hemoglobin: 10.3 g/dL — ABNORMAL LOW (ref 12.0–15.0)
Hemoglobin: 9.4 g/dL — ABNORMAL LOW (ref 12.0–15.0)
MCH: 29.9 pg (ref 26.0–34.0)
MCH: 29.9 pg (ref 26.0–34.0)
MCHC: 32.2 g/dL (ref 30.0–36.0)
MCHC: 32.3 g/dL (ref 30.0–36.0)
MCV: 93 fL (ref 80.0–100.0)
MCV: 92.7 fL (ref 80.0–100.0)
Platelets: 184 10*3/uL (ref 150–400)
Platelets: 179 10*3/uL (ref 150–400)
RBC: 3.44 MIL/uL — ABNORMAL LOW (ref 3.87–5.11)
RBC: 3.14 MIL/uL — ABNORMAL LOW (ref 3.87–5.11)
RDW: 15.7 % — ABNORMAL HIGH (ref 11.5–15.5)
RDW: 15.7 % — ABNORMAL HIGH (ref 11.5–15.5)
WBC: 12.3 10*3/uL — ABNORMAL HIGH (ref 4.0–10.5)
WBC: 11.1 10*3/uL — ABNORMAL HIGH (ref 4.0–10.5)
nRBC: 0 % (ref 0.0–0.2)
nRBC: 0 % (ref 0.0–0.2)

## 2024-06-08 LAB — POCT I-STAT 7, (LYTES, BLD GAS, ICA,H+H)
Acid-base deficit: 3 mmol/L — ABNORMAL HIGH (ref 0.0–2.0)
Bicarbonate: 23.2 mmol/L (ref 20.0–28.0)
Calcium, Ion: 1.14 mmol/L — ABNORMAL LOW (ref 1.15–1.40)
HCT: 32 % — ABNORMAL LOW (ref 36.0–46.0)
Hemoglobin: 10.9 g/dL — ABNORMAL LOW (ref 12.0–15.0)
O2 Saturation: 99 %
Patient temperature: 99.9
Potassium: 4.5 mmol/L (ref 3.5–5.1)
Sodium: 136 mmol/L (ref 135–145)
TCO2: 25 mmol/L (ref 22–32)
pCO2 arterial: 44.5 mmHg (ref 32–48)
pH, Arterial: 7.329 — ABNORMAL LOW (ref 7.35–7.45)
pO2, Arterial: 137 mmHg — ABNORMAL HIGH (ref 83–108)

## 2024-06-08 LAB — SURGICAL PATHOLOGY

## 2024-06-08 LAB — MAGNESIUM
Magnesium: 2.5 mg/dL — ABNORMAL HIGH (ref 1.7–2.4)
Magnesium: 2.6 mg/dL — ABNORMAL HIGH (ref 1.7–2.4)

## 2024-06-08 MED ORDER — KETOROLAC TROMETHAMINE 15 MG/ML IJ SOLN
15.0000 mg | Freq: Four times a day (QID) | INTRAMUSCULAR | Status: DC | PRN
  Administered 2024-06-08 – 2024-06-11 (×4): 15 mg via INTRAVENOUS
  Filled 2024-06-08 (×4): qty 1

## 2024-06-08 MED ORDER — TRAMADOL HCL 50 MG PO TABS
50.0000 mg | ORAL_TABLET | ORAL | Status: DC | PRN
  Administered 2024-06-08 – 2024-06-09 (×3): 50 mg via ORAL
  Filled 2024-06-08 (×3): qty 1

## 2024-06-08 MED ORDER — ENOXAPARIN SODIUM 40 MG/0.4ML IJ SOSY
40.0000 mg | PREFILLED_SYRINGE | Freq: Every day | INTRAMUSCULAR | Status: DC
  Administered 2024-06-08 – 2024-06-11 (×4): 40 mg via SUBCUTANEOUS
  Filled 2024-06-08 (×4): qty 0.4

## 2024-06-08 MED ORDER — INSULIN ASPART 100 UNIT/ML IJ SOLN
0.0000 [IU] | INTRAMUSCULAR | Status: DC
  Administered 2024-06-08: 4 [IU] via SUBCUTANEOUS
  Administered 2024-06-09 (×2): 2 [IU] via SUBCUTANEOUS

## 2024-06-08 MED ORDER — ORAL CARE MOUTH RINSE
15.0000 mL | OROMUCOSAL | Status: AC | PRN
Start: 2024-06-08 — End: ?

## 2024-06-08 MED FILL — Thrombin (Recombinant) For Soln 20000 Unit: CUTANEOUS | Qty: 1 | Status: AC

## 2024-06-08 NOTE — Hospital Course (Signed)
  PCP is Tisovec, Charlie ORN, MD Referring Provider is Court Dorn PARAS, MD   HPI:   The patient is a 78 year old woman with history of hypertension, hyperlipidemia, osteoarthritis and restless leg syndrome who presents with increasing shortness of breath over the past 6 months or so.  She feels like this has been present for about the past 2 years.  She has had no chest discomfort.  She denies any dizziness or syncope.  She denies orthopnea and PND.  She has noticed some swelling in her legs.  She had a 2D echo on 02/29/2024 showing moderate to severe central aortic insufficiency.  The aortic valve appeared trileaflet without stenosis.  Left ventricular ejection fraction was 55 to 60% with a diastolic diameter of 5.25 cm.  Right ventricular function was normal.  There is trivial MR.  She had a calcium scoring CT scan on 03/12/2024 which showed a coronary calcium score of 0.  TEE on 04/06/2024 showed low normal left ventricular systolic function with ejection fraction of 50 to 55%.  There was felt to be moderate to severe aortic insufficiency with a 3D VC of 0.4 cm.  Cardiac catheterization on the same day showed normal coronary anatomy without stenosis.  Left and right heart filling pressures were within normal limits.  Cardiac index was 3.32.  LVEDP was normal.  Following full review of the patient and all relevant studies Dr. Lucas recommended proceeding with aortic valve replacement.  Hospital course:  The patient was admitted electively on 06/07/2024 and taken the operating room at which time she underwent aortic valve replacement with a #25 Inspiris Resilia bovine bioprosthesis.  She tolerated the procedure well and was taken to the surgical intensive care unit in stable condition.  Postoperative hospital course:  Patient has done well.  She was extubated using standard post cardiac surgical protocols without difficulty.  She has maintained stable hemodynamics not requiring inotropic or  vasopressor support.  She is noted to have a borderline bradycardia at times and beta-blocker has been held.  She did require early temporary pacing to help support blood pressure.  Chest tubes were removed on postop day #1.  She does have some expected volume overload which is routine postcardiac surgery and not related to congestive heart failure.  She has undergone a routine diuresis.  Pain has been under adequate control using standard medications.  Renal function has remained within normal limits.  She was transitioned off the Insulin  drip. Her pre op HGA1C was 5.4. Accu checks and SS PRN will be stopped after transfer to the floor. She has some minor expected acute blood loss anemia which has stabilized.  She is on a course of routine post cardiac rehab modalities and pulmonary hygiene measures. She was restarted on Amlodipine for better BP control on 06/22. She was stable for transfer from the ICU to 4E for further convalescence on 06/22.

## 2024-06-08 NOTE — Discharge Summary (Incomplete)
 [address]       [place] [address]             [phone]    Physician Discharge Summary  Patient ID: Charlene Barnes MRN: [id number] DOB/AGE: [id number] 76 y.o.  Admit date: 06/07/2024 Discharge date: 06/08/2024  Admission Diagnoses:  Patient Active Problem List   Diagnosis Date Noted   S/P AVR (aortic valve replacement) 06/07/2024   Nonrheumatic aortic valve insufficiency 04/06/2024   Essential hypertension 01/18/2024   Dyspnea on exertion 01/18/2024   Hyperlipidemia 01/18/2024   RLS (restless legs syndrome) 05/30/2014     Discharge Diagnoses:  Patient Active Problem List   Diagnosis Date Noted   S/P AVR (aortic valve replacement) 06/07/2024   Nonrheumatic aortic valve insufficiency 04/06/2024   Essential hypertension 01/18/2024   Dyspnea on exertion 01/18/2024   Hyperlipidemia 01/18/2024   RLS (restless legs syndrome) 05/30/2014     Discharged Condition: {condition:18240}  No notes on file  Consults: {consultation:18241}  Significant Diagnostic Studies: {diagnostics:18242} DG Chest Port 1 View Result Date: 06/08/2024 CLINICAL DATA:  Status post aortic valve replacement. EXAM: PORTABLE CHEST 1 VIEW COMPARISON:  06/07/2024 FINDINGS: Postoperative changes from median sternotomy and aortic valve replacement. There is a pulmonary arterial catheter with tip in the expected location of the main pulmonary artery. Stable positioning mediastinal drains. Status post removal of enteric tube. Stable cardiomediastinal contour. New small left pleural effusion. Atelectatic changes in the left base. No frank interstitial edema. IMPRESSION: 1. New small left pleural effusion. 2. Left base atelectasis. 3. Interval removal of drainage catheters. No pneumothorax identified. Electronically Signed   By: Kimberley Penman M.D.   On: 06/08/2024 07:38   DG Chest Port 1 View Result Date: 06/07/2024 CLINICAL DATA:  [id number] S/P AVR (aortic valve replacement) [id number] EXAM:  PORTABLE CHEST - 1 VIEW COMPARISON:  June 06, 2024 FINDINGS: Right IJ approach introducer sheath with a pulmonary artery catheter terminating in the region of the main pulmonary artery. Endotracheal tube in place, with visualization of the distal tip positioned difficult to fully evaluate due to overlapping lines. However, the endotracheal tube does appear to be low lying possibly at the ostium of the right main bronchus. Esophagogastric tube terminates below the field of view with the last side hole overlying the region of the stomach. Mediastinal drains in place. Lower lung volumes. Bilateral perihilar interstitial opacities. No focal airspace consolidation, pleural effusion, or pneumothorax. Mild cardiomegaly. Aortic valve replacement. Epicardial pacing leads. Sternotomy wires. IMPRESSION: 1. Endotracheal tube in place with visualization of the distal tip difficult to fully evaluate due to overlapping tubes. However, the endotracheal tube does appear to be low lying possibly at the carina or at the level of the right main bronchus ostium. Repeat imaging should be considered, possibly with oblique tube positioning to better evaluate the endotracheal tube. 2. Mild interstitial edema.  No pneumothorax or pleural effusion. Electronically Signed   By: Rance Burrows M.D.   On: 06/07/2024 13:24   DG Chest 2 View Result Date: 06/07/2024 CLINICAL DATA:  Preop EXAM: CHEST - 2 VIEW COMPARISON:  None Available. FINDINGS: The heart size and mediastinal contours are within normal limits. Both lungs are clear. The visualized skeletal structures are unremarkable. IMPRESSION: No active cardiopulmonary disease. Electronically Signed   By: Fredrich Jefferson M.D.   On: 06/07/2024 07:25   VAS US  CAROTID Result Date: 06/06/2024 Carotid Arterial Duplex Study Patient Name:  Charlene Barnes  Date of Exam:   06/06/2024  Medical Rec #: [id number]        Accession #:    [id number] Date of Birth: [birth year]         Patient Gender: F Patient  Age:   43 years Exam Location[facility]  [facility] Procedure:      VAS US  CAROTID Referring Phys: Linder Revere --------------------------------------------------------------------------------  Indications:  Pre-op for valve replacement. Risk Factors: Hypertension, hyperlipidemia, coronary artery disease. Performing Technologist: Vonzell Guerin, RVT  Examination Guidelines: A complete evaluation includes B-mode imaging, spectral Doppler, color Doppler, and power Doppler as needed of all accessible portions of each vessel. Bilateral testing is considered an integral part of a complete examination. Limited examinations for reoccurring indications may be performed as noted.  Right Carotid Findings: +----------+--------+--------+--------+------------------+------------------+           PSV cm/sEDV cm/sStenosisPlaque DescriptionComments           +----------+--------+--------+--------+------------------+------------------+ CCA Prox  77      9                                                    +----------+--------+--------+--------+------------------+------------------+ CCA Distal64      14                                intimal thickening +----------+--------+--------+--------+------------------+------------------+ ICA Prox  43      10      1-39%                                        +----------+--------+--------+--------+------------------+------------------+ ICA Distal51      17                                                   +----------+--------+--------+--------+------------------+------------------+ ECA       56                                                           +----------+--------+--------+--------+------------------+------------------+ +----------+--------+-------+----------------+-------------------+           PSV cm/sEDV cmsDescribe        Arm Pressure (mmHG) +----------+--------+-------+----------------+-------------------+ [id number]              Multiphasic, WNL                    +----------+--------+-------+----------------+-------------------+ +---------+--------+--+--------+--+---------+ VertebralPSV cm/s39EDV cm/s11Antegrade +---------+--------+--+--------+--+---------+  Left Carotid Findings: +----------+--------+--------+--------+------------------+------------------+           PSV cm/sEDV cm/sStenosisPlaque DescriptionComments           +----------+--------+--------+--------+------------------+------------------+ CCA Prox  74      11                                                   +----------+--------+--------+--------+------------------+------------------+ CCA Distal54  11                                intimal thickening +----------+--------+--------+--------+------------------+------------------+ ICA Prox  45      14      1-39%                                        +----------+--------+--------+--------+------------------+------------------+ ICA Distal58      21                                                   +----------+--------+--------+--------+------------------+------------------+ ECA       46                                                           +----------+--------+--------+--------+------------------+------------------+ +----------+--------+--------+----------------+-------------------+           PSV cm/sEDV cm/sDescribe        Arm Pressure (mmHG) +----------+--------+--------+----------------+-------------------+ [id number]              Multiphasic, WNL                    +----------+--------+--------+----------------+-------------------+ +---------+--------+--+--------+-+---------+ VertebralPSV cm/s26EDV cm/s6Antegrade +---------+--------+--+--------+-+---------+   Summary: Right Carotid: Velocities in the right ICA are consistent with a 1-39% stenosis. Left Carotid: Velocities in the left ICA are consistent with a 1-39% stenosis. Vertebrals:   Bilateral vertebral arteries demonstrate antegrade flow. Subclavians: Normal flow hemodynamics were seen in bilateral subclavian              arteries. *See table(s) above for measurements and observations.  Electronically signed by Irvin Mantel on 06/06/2024 at 6:55:36 PM.    Final      Results for orders placed or performed during the hospital encounter of 06/07/24 (from the past 48 hours)  ABO/Rh     Status: None   Collection Time: 06/07/24  6:35 AM  Result Value Ref Range   ABO/RH(D)      O NEG Performed at [facility], [address], [place], [place] [address]   Hemoglobin and hematocrit, blood     Status: Abnormal   Collection Time: 06/07/24  6:53 AM  Result Value Ref Range   Hemoglobin 8.9 (L) 12.0 - 15.0 g/dL    Comment: REPEATED TO VERIFY RN KALEB EVERHART NOTIFIED AT 1018 [id number] BY BARON J    HCT 27.5 (L) 36.0 - 46.0 %    Comment: REPEATED TO VERIFY RN KALEB EVERHART NOTIFIED AT 1018 [id number] BY BARON J Performed at [facility], [address], [place], [place] [address]   Platelet count     Status: None   Collection Time: 06/07/24  6:53 AM  Result Value Ref Range   Platelets 229 150 - 400 K/uL    Comment: REPEATED TO VERIFY RN KALEB EVERHART NOTIFIED AT 1018 [id number] BY BARON J Performed at [facility], [address], [place], [place] [address]   I-STAT, [place] 8     Status: Abnormal   Collection Time: 06/07/24  7:54 AM  Result Value Ref Range   Sodium 138 135 - 145 mmol/L   Potassium 4.2 3.5 - 5.1 mmol/L   Chloride 103 98 - 111 mmol/L   BUN 20 8 - 23 mg/dL   Creatinine, Ser 1.61 0.44 - 1.00 mg/dL   Glucose, Bld 096 (H) 70 - 99 mg/dL    Comment: Glucose reference range applies only to samples taken after fasting for at least 8 hours.   Calcium, Ion 1.22 1.15 - 1.40 mmol/L   TCO2 29 22 - 32 mmol/L   Hemoglobin 12.6 12.0 - 15.0 g/dL   HCT 04.5 40.9 - 81.1 %  I-STAT 7, (LYTES, BLD GAS, ICA, H+H)     Status: Abnormal   Collection Time:  06/07/24  7:57 AM  Result Value Ref Range   pH, Arterial 7.379 7.35 - 7.45   pCO2 arterial 44.6 32 - 48 mmHg   pO2, Arterial 492 (H) 83 - 108 mmHg   Bicarbonate 26.3 20.0 - 28.0 mmol/L   TCO2 28 22 - 32 mmol/L   O2 Saturation 100 %   Acid-Base Excess 1.0 0.0 - 2.0 mmol/L   Sodium 138 135 - 145 mmol/L   Potassium 4.1 3.5 - 5.1 mmol/L   Calcium, Ion 1.22 1.15 - 1.40 mmol/L   HCT 37.0 36.0 - 46.0 %   Hemoglobin 12.6 12.0 - 15.0 g/dL   Sample type ARTERIAL   I-STAT, chem 8     Status: Abnormal   Collection Time: 06/07/24  8:44 AM  Result Value Ref Range   Sodium 137 135 - 145 mmol/L   Potassium 4.0 3.5 - 5.1 mmol/L   Chloride 103 98 - 111 mmol/L   BUN 21 8 - 23 mg/dL   Creatinine, Ser 9.14 0.44 - 1.00 mg/dL   Glucose, Bld 782 (H) 70 - 99 mg/dL    Comment: Glucose reference range applies only to samples taken after fasting for at least 8 hours.   Calcium, Ion 1.16 1.15 - 1.40 mmol/L   TCO2 26 22 - 32 mmol/L   Hemoglobin 12.6 12.0 - 15.0 g/dL   HCT 95.6 21.3 - 08.6 %  Surgical pathology     Status: None   Collection Time: 06/07/24  9:07 AM  Result Value Ref Range   SURGICAL PATHOLOGY      SURGICAL PATHOLOGY CASE: [id number] PATIENT: Donnal Fusi Surgical Pathology Report     Clinical History: severe aortic insufficiency (cm)     FINAL MICROSCOPIC DIAGNOSIS:  A. HEART VALVE LEAFLETS, AORTIC:      Cardiac valvular leaflet tissue with degenerative changes.   GROSS DESCRIPTION:  Received fresh are 3 fragments of tan membranous tissue ranging from 1.9 to 2.1 cm in greatest dimension, and each with a thickness of 0.1 cm. Great calcifications/excrescences are not grossly identified. Representative sections are submitted in 1 block. Margurette Shillings, 06/07/2024)  Final Diagnosis performed by Dillard Frame, MD.   Electronically signed 06/08/2024 Technical and / or Professional components performed at [facility], [address] [place], [place] [address].   Immunohistochemistry Technical component (if applicable) was performed at [facility]. [address], [place], [place] [address].   IMMUNOHISTOCHEMISTRY DISCLAI MER (if applicable): Some of these immunohistochemical stains may have been developed and the performance characteristics determine by [facility]. Some may not have been cleared or approved by the U.S. Food and Drug Administration. The FDA has determined that such clearance or approval is not necessary. This test  is used for clinical purposes. It should not be regarded as investigational or for research. This laboratory is certified under the Clinical Laboratory Improvement Amendments of 1988 (CLIA-88) as qualified to perform high complexity clinical laboratory testing.  The controls stained appropriately.   IHC stains are performed on formalin fixed, paraffin embedded tissue using a 3,3diaminobenzidine (DAB) chromogen and Leica Bond Autostainer System. The staining intensity of the nucleus is score manually and is reported as the percentage of tumor cell nuclei demonstrating specific nuclear staining. The specimens are fixed in 10% Neutral Formalin for at  least 6 hours and up to 72hrs. These tests are validated on decalcified tissue. Results should be interpreted with caution given the possibility of false negative results on decalcified specimens. Antibody Clones are as follows ER-clone 75F, PR-clone 16, Ki67- clone MM1. Some of these immunohistochemical stains may have been developed and the performance characteristics determined by [facility].   I-STAT, chem 8     Status: Abnormal   Collection Time: 06/07/24  9:33 AM  Result Value Ref Range   Sodium 137 135 - 145 mmol/L   Potassium 4.1 3.5 - 5.1 mmol/L   Chloride 102 98 - 111 mmol/L   BUN 16 8 - 23 mg/dL   Creatinine, Ser 4.09 0.44 - 1.00 mg/dL   Glucose, Bld 811 (H) 70 - 99 mg/dL    Comment: Glucose reference range  applies only to samples taken after fasting for at least 8 hours.   Calcium, Ion 1.09 (L) 1.15 - 1.40 mmol/L   TCO2 26 22 - 32 mmol/L   Hemoglobin 8.8 (L) 12.0 - 15.0 g/dL   HCT 91.4 (L) 78.2 - 95.6 %  I-STAT 7, (LYTES, BLD GAS, ICA, H+H)     Status: Abnormal   Collection Time: 06/07/24  9:55 AM  Result Value Ref Range   pH, Arterial 7.379 7.35 - 7.45   pCO2 arterial 39.4 32 - 48 mmHg   pO2, Arterial 268 (H) 83 - 108 mmHg   Bicarbonate 23.3 20.0 - 28.0 mmol/L   TCO2 24 22 - 32 mmol/L   O2 Saturation 100 %   Acid-base deficit 2.0 0.0 - 2.0 mmol/L   Sodium 138 135 - 145 mmol/L   Potassium 4.1 3.5 - 5.1 mmol/L   Calcium, Ion 1.08 (L) 1.15 - 1.40 mmol/L   HCT 26.0 (L) 36.0 - 46.0 %   Hemoglobin 8.8 (L) 12.0 - 15.0 g/dL   Sample type ARTERIAL   I-STAT, chem 8     Status: Abnormal   Collection Time: 06/07/24 10:28 AM  Result Value Ref Range   Sodium 136 135 - 145 mmol/L   Potassium 4.6 3.5 - 5.1 mmol/L   Chloride 102 98 - 111 mmol/L   BUN 16 8 - 23 mg/dL   Creatinine, Ser 2.13 0.44 - 1.00 mg/dL   Glucose, Bld 086 (H) 70 - 99 mg/dL    Comment: Glucose reference range applies only to samples taken after fasting for at least 8 hours.   Calcium, Ion 1.06 (L) 1.15 - 1.40 mmol/L   TCO2 26 22 - 32 mmol/L   Hemoglobin 8.2 (L) 12.0 - 15.0 g/dL   HCT 57.8 (L) 46.9 - 62.9 %  I-STAT, chem 8     Status: Abnormal   Collection Time: 06/07/24 10:46 AM  Result Value Ref Range   Sodium 135 135 - 145 mmol/L   Potassium 4.5 3.5 - 5.1 mmol/L   Chloride 101 98 - 111 mmol/L   BUN  18 8 - 23 mg/dL   Creatinine, Ser 9.14 0.44 - 1.00 mg/dL   Glucose, Bld 782 (H) 70 - 99 mg/dL    Comment: Glucose reference range applies only to samples taken after fasting for at least 8 hours.   Calcium, Ion 1.02 (L) 1.15 - 1.40 mmol/L   TCO2 26 22 - 32 mmol/L   Hemoglobin 8.5 (L) 12.0 - 15.0 g/dL   HCT 95.6 (L) 21.3 - 08.6 %  I-STAT 7, (LYTES, BLD GAS, ICA, H+H)     Status: Abnormal   Collection Time: 06/07/24 10:51  AM  Result Value Ref Range   pH, Arterial 7.465 (H) 7.35 - 7.45   pCO2 arterial 34.6 32 - 48 mmHg   pO2, Arterial 254 (H) 83 - 108 mmHg   Bicarbonate 24.9 20.0 - 28.0 mmol/L   TCO2 26 22 - 32 mmol/L   O2 Saturation 100 %   Acid-Base Excess 1.0 0.0 - 2.0 mmol/L   Sodium 136 135 - 145 mmol/L   Potassium 4.5 3.5 - 5.1 mmol/L   Calcium, Ion 1.01 (L) 1.15 - 1.40 mmol/L   HCT 26.0 (L) 36.0 - 46.0 %   Hemoglobin 8.8 (L) 12.0 - 15.0 g/dL   Sample type ARTERIAL   CBC     Status: Abnormal   Collection Time: 06/07/24 11:47 AM  Result Value Ref Range   WBC 13.9 (H) 4.0 - 10.5 K/uL   RBC 3.85 (L) 3.87 - 5.11 MIL/uL   Hemoglobin 11.4 (L) 12.0 - 15.0 g/dL    Comment: REPEATED TO VERIFY POST TRANSFUSION SPECIMEN CONFIRMED BY RN KATELYN SOUTHARD AT [phone]    HCT 34.7 (L) 36.0 - 46.0 %   MCV 90.1 80.0 - 100.0 fL   MCH 29.6 26.0 - 34.0 pg   MCHC 32.9 30.0 - 36.0 g/dL   RDW 95.2 84.1 - 32.4 %   Platelets 215 150 - 400 K/uL    Comment: REPEATED TO VERIFY   nRBC 0.0 0.0 - 0.2 %    Comment: Performed at [facility], [address], [place], [place] [address]  Protime-INR     Status: Abnormal   Collection Time: 06/07/24 11:47 AM  Result Value Ref Range   Prothrombin Time 17.3 (H) 11.4 - 15.2 seconds   INR 1.4 (H) 0.8 - 1.2    Comment: (NOTE) INR goal varies based on device and disease states. Performed at [facility], [address] [place], [place] [address]   APTT     Status: None   Collection Time: 06/07/24 11:47 AM  Result Value Ref Range   aPTT 32 24 - 36 seconds    Comment: Performed at [facility], [address] [place], [place] [address]  Glucose, capillary     Status: Abnormal   Collection Time: 06/07/24 12:09 PM  Result Value Ref Range   Glucose-Capillary 131 (H) 70 - 99 mg/dL    Comment: Glucose reference range applies only to samples taken after fasting for at least 8 hours.  I-STAT 7, (LYTES, BLD GAS, ICA, H+H)     Status: Abnormal    Collection Time: 06/07/24 12:11 PM  Result Value Ref Range   pH, Arterial 7.400 7.35 - 7.45   pCO2 arterial 34.5 32 - 48 mmHg   pO2, Arterial 82 (L) 83 - 108 mmHg   Bicarbonate 21.7 20.0 - 28.0 mmol/L   TCO2 23 22 - 32 mmol/L   O2 Saturation 97 %  Acid-base deficit 3.0 (H) 0.0 - 2.0 mmol/L   Sodium 139 135 - 145 mmol/L   Potassium 3.3 (L) 3.5 - 5.1 mmol/L   Calcium, Ion 1.09 (L) 1.15 - 1.40 mmol/L   HCT 33.0 (L) 36.0 - 46.0 %   Hemoglobin 11.2 (L) 12.0 - 15.0 g/dL   Patient temperature 32.4 C    Sample type ARTERIAL   Glucose, capillary     Status: Abnormal   Collection Time: 06/07/24 12:57 PM  Result Value Ref Range   Glucose-Capillary 125 (H) 70 - 99 mg/dL    Comment: Glucose reference range applies only to samples taken after fasting for at least 8 hours.  Glucose, capillary     Status: None   Collection Time: 06/07/24  3:05 PM  Result Value Ref Range   Glucose-Capillary 99 70 - 99 mg/dL    Comment: Glucose reference range applies only to samples taken after fasting for at least 8 hours.  Glucose, capillary     Status: Abnormal   Collection Time: 06/07/24  4:00 PM  Result Value Ref Range   Glucose-Capillary 140 (H) 70 - 99 mg/dL    Comment: Glucose reference range applies only to samples taken after fasting for at least 8 hours.  Glucose, capillary     Status: Abnormal   Collection Time: 06/07/24  5:14 PM  Result Value Ref Range   Glucose-Capillary 128 (H) 70 - 99 mg/dL    Comment: Glucose reference range applies only to samples taken after fasting for at least 8 hours.  CBC     Status: Abnormal   Collection Time: 06/07/24  5:17 PM  Result Value Ref Range   WBC 18.5 (H) 4.0 - 10.5 K/uL   RBC 3.81 (L) 3.87 - 5.11 MIL/uL   Hemoglobin 11.3 (L) 12.0 - 15.0 g/dL   HCT 40.1 (L) 02.7 - 25.3 %   MCV 91.1 80.0 - 100.0 fL   MCH 29.7 26.0 - 34.0 pg   MCHC 32.6 30.0 - 36.0 g/dL   RDW 66.4 40.3 - 47.4 %   Platelets 266 150 - 400 K/uL   nRBC 0.0 0.0 - 0.2 %    Comment:  Performed at [facility], [address] [place], [place] [address]  Basic metabolic panel     Status: Abnormal   Collection Time: 06/07/24  5:17 PM  Result Value Ref Range   Sodium 137 135 - 145 mmol/L   Potassium 4.9 3.5 - 5.1 mmol/L   Chloride 108 98 - 111 mmol/L   CO2 23 22 - 32 mmol/L   Glucose, Bld 116 (H) 70 - 99 mg/dL    Comment: Glucose reference range applies only to samples taken after fasting for at least 8 hours.   BUN 11 8 - 23 mg/dL   Creatinine, Ser 3.87 0.44 - 1.00 mg/dL   Calcium 7.5 (L) 8.9 - 10.3 mg/dL   GFR, Estimated >56 >43 mL/min    Comment: (NOTE) Calculated using the CKD-EPI Creatinine Equation (2021)    Anion gap 6 5 - 15    Comment: ELECTROLYTES REPEATED TO VERIFY Performed at [facility], [address], [place], [place] [address]   Magnesium     Status: Abnormal   Collection Time: 06/07/24  5:17 PM  Result Value Ref Range   Magnesium 3.4 (H) 1.7 - 2.4 mg/dL    Comment: Performed at [facility], [address] [place], [place] [address]  Glucose, capillary  Status: Abnormal   Collection Time: 06/07/24  6:04 PM  Result Value Ref Range   Glucose-Capillary 125 (H) 70 - 99 mg/dL    Comment: Glucose reference range applies only to samples taken after fasting for at least 8 hours.  I-STAT 7, (LYTES, BLD GAS, ICA, H+H)     Status: Abnormal   Collection Time: 06/07/24  6:14 PM  Result Value Ref Range   pH, Arterial 7.337 (L) 7.35 - 7.45   pCO2 arterial 38.8 32 - 48 mmHg   pO2, Arterial 120 (H) 83 - 108 mmHg   Bicarbonate 20.6 20.0 - 28.0 mmol/L   TCO2 22 22 - 32 mmol/L   O2 Saturation 98 %   Acid-base deficit 5.0 (H) 0.0 - 2.0 mmol/L   Sodium 139 135 - 145 mmol/L   Potassium 4.5 3.5 - 5.1 mmol/L   Calcium, Ion 1.11 (L) 1.15 - 1.40 mmol/L   HCT 32.0 (L) 36.0 - 46.0 %   Hemoglobin 10.9 (L) 12.0 - 15.0 g/dL   Patient temperature 213.0 F    Sample type ARTERIAL   Glucose, capillary     Status: Abnormal   Collection Time:  06/07/24  7:52 PM  Result Value Ref Range   Glucose-Capillary 156 (H) 70 - 99 mg/dL    Comment: Glucose reference range applies only to samples taken after fasting for at least 8 hours.  I-STAT 7, (LYTES, BLD GAS, ICA, H+H)     Status: Abnormal   Collection Time: 06/07/24  8:01 PM  Result Value Ref Range   pH, Arterial 7.329 (L) 7.35 - 7.45   pCO2 arterial 44.5 32 - 48 mmHg   pO2, Arterial 137 (H) 83 - 108 mmHg   Bicarbonate 23.2 20.0 - 28.0 mmol/L   TCO2 25 22 - 32 mmol/L   O2 Saturation 99 %   Acid-base deficit 3.0 (H) 0.0 - 2.0 mmol/L   Sodium 136 135 - 145 mmol/L   Potassium 4.5 3.5 - 5.1 mmol/L   Calcium, Ion 1.14 (L) 1.15 - 1.40 mmol/L   HCT 32.0 (L) 36.0 - 46.0 %   Hemoglobin 10.9 (L) 12.0 - 15.0 g/dL   Patient temperature 86.5 F    Sample type ARTERIAL   Glucose, capillary     Status: Abnormal   Collection Time: 06/07/24  9:01 PM  Result Value Ref Range   Glucose-Capillary 157 (H) 70 - 99 mg/dL    Comment: Glucose reference range applies only to samples taken after fasting for at least 8 hours.  Glucose, capillary     Status: Abnormal   Collection Time: 06/07/24 10:19 PM  Result Value Ref Range   Glucose-Capillary 125 (H) 70 - 99 mg/dL    Comment: Glucose reference range applies only to samples taken after fasting for at least 8 hours.  Glucose, capillary     Status: None   Collection Time: 06/07/24 11:02 PM  Result Value Ref Range   Glucose-Capillary 98 70 - 99 mg/dL    Comment: Glucose reference range applies only to samples taken after fasting for at least 8 hours.  Glucose, capillary     Status: Abnormal   Collection Time: 06/08/24 12:09 AM  Result Value Ref Range   Glucose-Capillary 125 (H) 70 - 99 mg/dL    Comment: Glucose reference range applies only to samples taken after fasting for at least 8 hours.  Glucose, capillary     Status: Abnormal   Collection Time: 06/08/24  1:02 AM  Result Value Ref Range  Glucose-Capillary 118 (H) 70 - 99 mg/dL    Comment:  Glucose reference range applies only to samples taken after fasting for at least 8 hours.  Glucose, capillary     Status: Abnormal   Collection Time: 06/08/24  2:00 AM  Result Value Ref Range   Glucose-Capillary 103 (H) 70 - 99 mg/dL    Comment: Glucose reference range applies only to samples taken after fasting for at least 8 hours.  Glucose, capillary     Status: Abnormal   Collection Time: 06/08/24  3:09 AM  Result Value Ref Range   Glucose-Capillary 117 (H) 70 - 99 mg/dL    Comment: Glucose reference range applies only to samples taken after fasting for at least 8 hours.  CBC     Status: Abnormal   Collection Time: 06/08/24  5:10 AM  Result Value Ref Range   WBC 12.3 (H) 4.0 - 10.5 K/uL   RBC 3.44 (L) 3.87 - 5.11 MIL/uL   Hemoglobin 10.3 (L) 12.0 - 15.0 g/dL   HCT 40.9 (L) 81.1 - 91.4 %   MCV 93.0 80.0 - 100.0 fL   MCH 29.9 26.0 - 34.0 pg   MCHC 32.2 30.0 - 36.0 g/dL   RDW 78.2 (H) 95.6 - 21.3 %   Platelets 184 150 - 400 K/uL   nRBC 0.0 0.0 - 0.2 %    Comment: Performed at [facility], [address] [place], [place] [address]  Basic metabolic panel     Status: Abnormal   Collection Time: 06/08/24  5:10 AM  Result Value Ref Range   Sodium 131 (L) 135 - 145 mmol/L   Potassium 3.9 3.5 - 5.1 mmol/L   Chloride 102 98 - 111 mmol/L   CO2 23 22 - 32 mmol/L   Glucose, Bld 120 (H) 70 - 99 mg/dL    Comment: Glucose reference range applies only to samples taken after fasting for at least 8 hours.   BUN 6 (L) 8 - 23 mg/dL   Creatinine, Ser 8.46 0.44 - 1.00 mg/dL   Calcium 6.9 (L) 8.9 - 10.3 mg/dL   GFR, Estimated >96 >29 mL/min    Comment: (NOTE) Calculated using the CKD-EPI Creatinine Equation (2021)    Anion gap 6 5 - 15    Comment: Performed at [facility], [address] [place], [place] [address]  Magnesium     Status: Abnormal   Collection Time: 06/08/24  5:10 AM  Result Value Ref Range   Magnesium 2.5 (H) 1.7 - 2.4 mg/dL    Comment: Performed at [facility], [address] [place], [place] [address]  Glucose, capillary     Status: Abnormal   Collection Time: 06/08/24  7:54 AM  Result Value Ref Range   Glucose-Capillary 113 (H) 70 - 99 mg/dL    Comment: Glucose reference range applies only to samples taken after fasting for at least 8 hours.  Glucose, capillary     Status: Abnormal   Collection Time: 06/08/24 12:01 PM  Result Value Ref Range   Glucose-Capillary 182 (H) 70 - 99 mg/dL    Comment: Glucose reference range applies only to samples taken after fasting for at least 8 hours.     Treatments: surgery: *** 06/07/2024 MATIKA BARTELL [id number]   Surgeon:  Bartley Lightning, MD   First Assistant: Matt Song,  PA-C     Preoperative Diagnosis:  Severe aortic insufficiency     Postoperative Diagnosis:  Same  Procedure:   Median Sternotomy Extracorporeal circulation 3.   Aortic valve replacement using a 23 mm Edwards INSPIRIS RESILIA  pericardial valve.   Anesthesia:  General Endotracheal  Discharge Exam: Blood pressure (!) 82/54, pulse 80, temperature (!) 100.4 F (38 C), resp. rate 13, height 5' 4 (1.626 m), weight 64.7 kg, SpO2 94%. {physical [id number]   Discharge Medications:  The patient has been discharged on:   1.Beta Blocker:  Yes [   ]                              No   [   ]                              If No, reason:  2.Ace Inhibitor/ARB: Yes [   ]                                     No  [    ]                                     If No, reason:  3.Statin:   Yes [   ]                  No  [   ]                  If No, reason:  4.Ecasa:  Yes  [   ]                  No   [   ]                  If No, reason:  Patient had ACS upon admission:  Plavix/P2Y12 inhibitor: Yes [   ]                                      No  [   ]      Allergies as of 06/08/2024       Reactions   Latex Rash     Med Rec must be completed prior to using this  SMARTLINK***        Signed:  Lindi Revering, PA-C  06/08/2024, 12:12 PM

## 2024-06-08 NOTE — TOC Initial Note (Signed)
 Transition of Care [facility]) - Initial/Assessment Note    Patient Details  Name: Charlene Barnes MRN: [id number] Date of Birth: 09-Jan-1946  Transition of Care [facility]) CM/SW Contact:    Benjiman Bras, RN Phone Number: [phone] 06/08/2024, 5:20 PM  Clinical Narrative:                 TOC CM spoke to pt and daughter at bedside. Pt gave permission to speak to dtr, Isa Manuel.  Pt lives at home with husband. She was independent pta.  Offered choice for [facility], agreeable to [facility]. Medicare.gov list with rating provided and placed on chart.  Will continue to follow for dc needs.   Expected Discharge Plan: Home w Hospice Care Barriers to Discharge: Continued Medical Work up   Patient Goals and CMS Choice Patient states their goals for this hospitalization and ongoing recovery are:: wants to go home CMS Medicare.gov Compare Post Acute Care list provided to:: Patient Represenative (must comment) Choice offered to / list presented to : Adult Children      Expected Discharge Plan and Services   Discharge Planning Services: CM Consult Post Acute Care Choice: Home Health Living arrangements for the past 2 months: Single Family Home                           HH Arranged: RN HH Agency: [facility]        Prior Living Arrangements/Services Living arrangements for the past 2 months: Single Family Home Lives with:: Spouse Patient language and need for interpreter reviewed:: Yes Do you feel safe going back to the place where you live?: Yes      Need for Family Participation in Patient Care: Yes (Comment) Care giver support system in place?: Yes (comment) Current home services: DME ([employer], bedside commode) Criminal Activity/Legal Involvement Pertinent to Current Situation/Hospitalization: No - Comment as needed  Activities of Daily Living   ADL Screening (condition at time of admission) Independently performs ADLs?: Yes (appropriate for  developmental age) Is the patient deaf or have difficulty hearing?: No Does the patient have difficulty seeing, even when wearing glasses/contacts?: No Does the patient have difficulty concentrating, remembering, or making decisions?: No  Permission Sought/Granted Permission sought to share information with : Case Manager, Family Supports, PCP Permission granted to share information with : Yes, Verbal Permission Granted  Share Information with NAME: Kelsie Zaborowski  Permission granted to share info w AGENCY: Home Health, DME, PCP  Permission granted to share info w Relationship: husband  Permission granted to share info w Contact Information: [phone]  Emotional Assessment Appearance:: Appears stated age Attitude/Demeanor/Rapport: Engaged Affect (typically observed): Accepting Orientation: : Oriented to Self, Oriented to Place, Oriented to  Time, Oriented to Situation   Psych Involvement: No (comment)  Admission diagnosis:  Severe aortic insufficiency [I35.1] S/P AVR (aortic valve replacement) [Z95.2] Patient Active Problem List   Diagnosis Date Noted   S/P AVR (aortic valve replacement) 06/07/2024   Nonrheumatic aortic valve insufficiency 04/06/2024   Essential hypertension 01/18/2024   Dyspnea on exertion 01/18/2024   Hyperlipidemia 01/18/2024   RLS (restless legs syndrome) 05/30/2014   PCP:  Suzzanne Estrin, MD Pharmacy:   [facility] [place], [place] - [address] [place] [address] Phone: [phone] Fax: [phone]     Social Drivers of Health (SDOH) Social History: SDOH Screenings   Food Insecurity: Patient  Unable To Answer (06/07/2024)  Housing: Unknown (06/07/2024)  Transportation Needs: Patient Unable To Answer (06/07/2024)  Utilities: Patient Unable To Answer (06/07/2024)  Financial Resource Strain: Low Risk  (05/25/2022)   Received from [facility]  Physical Activity: Insufficiently Active  (05/25/2022)   Received from [facility]  Social Connections: Unknown (06/07/2024)  Stress: No Stress Concern Present (05/25/2022)   Received from [facility]  Tobacco Use: Unknown (06/07/2024)   SDOH Interventions:     Readmission Risk Interventions     No data to display

## 2024-06-08 NOTE — Progress Notes (Signed)
 Hypoglycemic Event  CBG: 53  Treatment: 4 oz juice/soda  Symptoms: Sweaty  Follow-up CBG: 120  Possible Reasons for Event: Other: Received SSI earlier for CBG 182    Charlene Barnes C [address]

## 2024-06-08 NOTE — Progress Notes (Signed)
      [address]       [place] [address]             [phone]    POD # 1 AVR  Resting in bed  BP (!) 85/52   Pulse 80   Temp 98.4 F (36.9 C) (Oral)   Resp 13   Ht 5' 4 (1.626 m)   Wt 64.7 kg   SpO2 95%   BMI 24.48 kg/m  BP cuff and A line not correlating   Intake/Output Summary (Last 24 hours) at 06/08/2024 1704 Last data filed at 06/08/2024 1600 Gross per 24 hour  Intake 2304.26 ml  Output 3205 ml  Net -900.74 ml   1200 negative so far since 7 AM   K 4.3, creatinine 0.57 Hct 29  Doing well POD # 1  Chan Rosasco C. Luna Salinas, MD [facility] [phone]

## 2024-06-08 NOTE — Progress Notes (Signed)
 1 Day Post-Op Procedure(s) (LRB): AORTIC VALVE REPLACEMENT USING INSPIRIS RESILIA AORTIC VALVE SIZE 23 MM (N/A) ECHOCARDIOGRAM, TRANSESOPHAGEAL, INTRAOPERATIVE (N/A) Subjective: Sleepy this am. No problems overnight  Objective: Vital signs in last 24 hours: Temp:  [95.7 F (35.4 C)-100.2 F (37.9 C)] 99.3 F (37.4 C) (06/20 0700) Pulse Rate:  [73-175] 80 (06/20 0700) Cardiac Rhythm: A-V Sequential paced (06/20 0430) Resp:  [0-22] 13 (06/20 0700) BP: (80-130)/(37-75) 92/50 (06/20 0700) SpO2:  [78 %-100 %] 96 % (06/20 0700) Arterial Line BP: (79-150)/(39-101) 95/48 (06/20 0700) FiO2 (%):  [40 %-50 %] 40 % (06/19 1753) Weight:  [64.7 kg] 64.7 kg (06/20 0600)  Hemodynamic parameters for last 24 hours: PAP: (24-55)/(7-39) 33/17 CVP:  [1 mmHg-35 mmHg] 10 mmHg CO:  [3.2 L/min-4.6 L/min] 4.4 L/min CI:  [1.9 L/min/m2-2.7 L/min/m2] 2.6 L/min/m2  Intake/Output from previous day: 06/19 0701 - 06/20 0700 In: 5003.6 [P.O.:400; I.V.:1981.2; Blood:370; IV Piggyback:2252.4] Out: 4735 [Urine:3230; Blood:1125; Chest Tube:380] Intake/Output this shift: No intake/output data recorded.  General appearance: alert and cooperative Neurologic: intact Heart: regular rate and rhythm Lungs: clear to auscultation bilaterally Extremities: edema mild Wound: dressings dry  Lab Results: Recent Labs    06/07/24 1717 06/07/24 1814 06/07/24 2001 06/08/24 0510  WBC 18.5*  --   --  12.3*  HGB 11.3*   < > 10.9* 10.3*  HCT 34.7*   < > 32.0* 32.0*  PLT 266  --   --  184   < > = values in this interval not displayed.   BMET:  Recent Labs    06/07/24 1717 06/07/24 1814 06/07/24 2001 06/08/24 0510  NA 137   < > 136 131*  K 4.9   < > 4.5 3.9  CL 108  --   --  102  CO2 23  --   --  23  GLUCOSE 116*  --   --  120*  BUN 11  --   --  6*  CREATININE 0.51  --   --  0.51  CALCIUM 7.5*  --   --  6.9*   < > = values in this interval not displayed.    PT/INR:  Recent Labs    06/07/24 1147  LABPROT  17.3*  INR 1.4*   ABG    Component Value Date/Time   PHART 7.329 (L) 06/07/2024 2001   HCO3 23.2 06/07/2024 2001   TCO2 25 06/07/2024 2001   ACIDBASEDEF 3.0 (H) 06/07/2024 2001   O2SAT 99 06/07/2024 2001   CBG (last 3)  Recent Labs    06/08/24 0102 06/08/24 0200 06/08/24 0309  GLUCAP 118* 103* 117*   CXR: mild left basilar atelectasis  ECG: sinus brady 54. 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 Sinus brady this am but her baseline preop HR was 40's. Hold off on BB. Continue atrial pacing for now to support BP.  DC chest tubes.  Keep pacer wires.  Hold off on diuresis today until BP better.  She is sleepy from pain meds and trazodone for sleep. Will dc trazodone. Use toradol for pain.  IS, OOB.   LOS: 1 day    Charlene Barnes 06/08/2024

## 2024-06-08 NOTE — Anesthesia Postprocedure Evaluation (Signed)
 Anesthesia Post Note  Patient: Charlene Barnes  Procedure(s) Performed: AORTIC VALVE REPLACEMENT USING INSPIRIS RESILIA AORTIC VALVE SIZE 23 MM (Chest) ECHOCARDIOGRAM, TRANSESOPHAGEAL, INTRAOPERATIVE     Patient location during evaluation: SICU Anesthesia Type: General Level of consciousness: sedated Pain management: pain level controlled Vital Signs Assessment: post-procedure vital signs reviewed and stable Respiratory status: patient remains intubated per anesthesia plan Cardiovascular status: stable Postop Assessment: no apparent nausea or vomiting Anesthetic complications: no   No notable events documented.  Last Vitals:  Vitals:   06/08/24 2045 06/08/24 2100  BP: (!) 87/52   Pulse: 79 80  Resp: 16 12  Temp:    SpO2: 95% 95%    Last Pain:  Vitals:   06/08/24 2015  TempSrc: Oral  PainSc:                  Frans Valente E

## 2024-06-09 ENCOUNTER — Inpatient Hospital Stay (HOSPITAL_COMMUNITY)

## 2024-06-09 LAB — CBC
HCT: 30.7 % — ABNORMAL LOW (ref 36.0–46.0)
Hemoglobin: 9.8 g/dL — ABNORMAL LOW (ref 12.0–15.0)
MCH: 30 pg (ref 26.0–34.0)
MCHC: 31.9 g/dL (ref 30.0–36.0)
MCV: 93.9 fL (ref 80.0–100.0)
Platelets: 192 10*3/uL (ref 150–400)
RBC: 3.27 MIL/uL — ABNORMAL LOW (ref 3.87–5.11)
RDW: 15.8 % — ABNORMAL HIGH (ref 11.5–15.5)
WBC: 11.7 10*3/uL — ABNORMAL HIGH (ref 4.0–10.5)
nRBC: 0 % (ref 0.0–0.2)

## 2024-06-09 LAB — BASIC METABOLIC PANEL WITH GFR
Anion gap: 6 (ref 5–15)
BUN: 8 mg/dL (ref 8–23)
CO2: 25 mmol/L (ref 22–32)
Calcium: 7.9 mg/dL — ABNORMAL LOW (ref 8.9–10.3)
Chloride: 104 mmol/L (ref 98–111)
Creatinine, Ser: 0.47 mg/dL (ref 0.44–1.00)
GFR, Estimated: >60 mL/min (ref >60)
Glucose, Bld: 113 mg/dL — ABNORMAL HIGH (ref 70–99)
Potassium: 4.3 mmol/L (ref 3.5–5.1)
Sodium: 135 mmol/L (ref 135–145)

## 2024-06-09 LAB — GLUCOSE, CAPILLARY
Glucose-Capillary: 134 mg/dL — ABNORMAL HIGH (ref 70–99)
Glucose-Capillary: 99 mg/dL (ref 70–99)
Glucose-Capillary: 107 mg/dL — ABNORMAL HIGH (ref 70–99)
Glucose-Capillary: 108 mg/dL — ABNORMAL HIGH (ref 70–99)
Glucose-Capillary: 132 mg/dL — ABNORMAL HIGH (ref 70–99)

## 2024-06-09 NOTE — Progress Notes (Signed)
   [address] [place] [address]             [phone]     POD # 2 AVR  Resting in bed  BP 121/73 (BP Location: Right Arm)   Pulse 66   Temp 98.6 F (37 C) (Oral)   Resp 20   Ht 5' 4 (1.626 m)   Wt 67.1 kg   SpO2 98%   BMI 25.39 kg/m    Intake/Output Summary (Last 24 hours) at 06/09/2024 1812 Last data filed at 06/09/2024 1100 Gross per 24 hour  Intake 840.33 ml  Output 2200 ml  Net -1359.67 ml   Stable day, BP improved Should be able to go to 4E tomorrow  Elspeth C. Kerrin, MD [facility] [phone]

## 2024-06-09 NOTE — Progress Notes (Signed)
 2 Days Post-Op Procedure(s) (LRB): AORTIC VALVE REPLACEMENT USING INSPIRIS RESILIA AORTIC VALVE SIZE 23 MM (N/A) ECHOCARDIOGRAM, TRANSESOPHAGEAL, INTRAOPERATIVE (N/A) Subjective: No complaints this AM Just back from walking 2 laps around unit  Objective: Vital signs in last 24 hours: Temp:  [98 F (36.7 C)-100.4 F (38 C)] 98 F (36.7 C) (06/20 2015) Pulse Rate:  [59-83] 59 (06/21 0807) Cardiac Rhythm: Atrial paced (06/21 0800) Resp:  [10-24] 15 (06/21 0807) BP: (79-144)/(48-127) 97/50 (06/21 0807) SpO2:  [90 %-98 %] 92 % (06/21 0807) Arterial Line BP: (88-157)/(38-79) 99/79 (06/20 1845) Weight:  [67.1 kg] 67.1 kg (06/21 0600)  Hemodynamic parameters for last 24 hours: PAP: (39-40)/(16-17) 39/17 CVP:  [11 mmHg-13 mmHg] 13 mmHg CO:  [5.5 L/min] 5.5 L/min CI:  [3.3 L/min/m2] 3.3 L/min/m2  Intake/Output from previous day: 06/20 0701 - 06/21 0700 In: 684.1 [I.V.:383.8; IV Piggyback:300.4] Out: 3545 [Urine:3525; Chest Tube:20] Intake/Output this shift: Total I/O In: 5.3 [I.V.:5.3] Out: -   General appearance: alert, cooperative, and no distress Neurologic: intact Heart: regular rate and rhythm Lungs: diminished breath sounds bibasilar Abdomen: normal findings: soft, non-tender  Lab Results: Recent Labs    06/08/24 1555 06/09/24 0602  WBC 11.1* 11.7*  HGB 9.4* 9.8*  HCT 29.1* 30.7*  PLT 179 192   BMET:  Recent Labs    06/08/24 1555 06/09/24 0602  NA 135 135  K 4.3 4.3  CL 104 104  CO2 26 25  GLUCOSE 79 113*  BUN 8 8  CREATININE 0.57 0.47  CALCIUM 7.4* 7.9*    PT/INR:  Recent Labs    06/07/24 1147  LABPROT 17.3*  INR 1.4*   ABG    Component Value Date/Time   PHART 7.329 (L) 06/07/2024 2001   HCO3 23.2 06/07/2024 2001   TCO2 25 06/07/2024 2001   ACIDBASEDEF 3.0 (H) 06/07/2024 2001   O2SAT 99 06/07/2024 2001   CBG (last 3)  Recent Labs    06/08/24 2343 06/09/24 0304 06/09/24 0631  GLUCAP 114* 134* 99    Assessment/Plan: S/P  Procedure(s) (LRB): AORTIC VALVE REPLACEMENT USING INSPIRIS RESILIA AORTIC VALVE SIZE 23 MM (N/A) ECHOCARDIOGRAM, TRANSESOPHAGEAL, INTRAOPERATIVE (N/A) POD # 2 NEURO- intact CV- in SR at 60, baseline is SB in 50s  Will keep pacer wires- VVI at 50 backup  Was on neo overnight for hypotension, off now  ASA RESP- CXR with right basilar atelectasis + effusion  IS, diurese when BP allows RENAL- creatinine and lytes normal  Dc Foley ENDO- CBG well controlled GI- tolerating diet SCD + enoxaparin  Continue cardiac rehab  LOS: 2 days    Elspeth JAYSON Millers 06/09/2024

## 2024-06-10 ENCOUNTER — Inpatient Hospital Stay (HOSPITAL_COMMUNITY)

## 2024-06-10 LAB — BASIC METABOLIC PANEL WITH GFR
Anion gap: 8 (ref 5–15)
BUN: 9 mg/dL (ref 8–23)
CO2: 23 mmol/L (ref 22–32)
Calcium: 8.2 mg/dL — ABNORMAL LOW (ref 8.9–10.3)
Chloride: 106 mmol/L (ref 98–111)
Creatinine, Ser: 0.49 mg/dL (ref 0.44–1.00)
GFR, Estimated: >60 mL/min (ref >60)
Glucose, Bld: 100 mg/dL — ABNORMAL HIGH (ref 70–99)
Potassium: 4 mmol/L (ref 3.5–5.1)
Sodium: 137 mmol/L (ref 135–145)

## 2024-06-10 LAB — GLUCOSE, CAPILLARY
Glucose-Capillary: 107 mg/dL — ABNORMAL HIGH (ref 70–99)
Glucose-Capillary: 120 mg/dL — ABNORMAL HIGH (ref 70–99)
Glucose-Capillary: 121 mg/dL — ABNORMAL HIGH (ref 70–99)
Glucose-Capillary: 132 mg/dL — ABNORMAL HIGH (ref 70–99)
Glucose-Capillary: 140 mg/dL — ABNORMAL HIGH (ref 70–99)
Glucose-Capillary: 92 mg/dL (ref 70–99)

## 2024-06-10 LAB — ECHO INTRAOPERATIVE TEE
AR max vel: 1.71 cm2
AV Area VTI: 1.91 cm2
AV Area mean vel: 1.68 cm2
AV Mean grad: 5 mmHg
AV Peak grad: 8.8 mmHg
AV Vena cont: 0.43 cm
Ao pk vel: 1.48 m/s
P 1/2 time: 879 ms
S' Lateral: 3.9 cm
Weight: 2240 [oz_av]

## 2024-06-10 LAB — CBC
HCT: 30.6 % — ABNORMAL LOW (ref 36.0–46.0)
Hemoglobin: 9.8 g/dL — ABNORMAL LOW (ref 12.0–15.0)
MCH: 29.9 pg (ref 26.0–34.0)
MCHC: 32 g/dL (ref 30.0–36.0)
MCV: 93.3 fL (ref 80.0–100.0)
Platelets: 221 10*3/uL (ref 150–400)
RBC: 3.28 MIL/uL — ABNORMAL LOW (ref 3.87–5.11)
RDW: 15.6 % — ABNORMAL HIGH (ref 11.5–15.5)
WBC: 10.8 10*3/uL — ABNORMAL HIGH (ref 4.0–10.5)
nRBC: 0 % (ref 0.0–0.2)

## 2024-06-10 MED ORDER — POTASSIUM CHLORIDE ER 10 MEQ PO TBCR
20.0000 meq | EXTENDED_RELEASE_TABLET | Freq: Every day | ORAL | Status: DC
  Administered 2024-06-10 – 2024-06-12 (×3): 20 meq via ORAL
  Filled 2024-06-10 (×5): qty 2

## 2024-06-10 MED ORDER — SODIUM CHLORIDE 0.9% FLUSH: 3.0000 mL | INTRAVENOUS | Status: DC | PRN

## 2024-06-10 MED ORDER — ~~LOC~~ CARDIAC SURGERY, PATIENT & FAMILY EDUCATION
Freq: Once | Status: AC
Start: 2024-06-10 — End: 2024-06-10

## 2024-06-10 MED ORDER — MAGNESIUM HYDROXIDE 400 MG/5ML PO SUSP
15.0000 mL | Freq: Every day | ORAL | Status: DC | PRN
Start: 2024-06-10 — End: 2024-06-12

## 2024-06-10 MED ORDER — FUROSEMIDE 40 MG PO TABS
40.0000 mg | ORAL_TABLET | Freq: Every day | ORAL | Status: DC
  Administered 2024-06-10 – 2024-06-12 (×3): 40 mg via ORAL
  Filled 2024-06-10 (×3): qty 1

## 2024-06-10 MED ORDER — TRAZODONE HCL 50 MG PO TABS
150.0000 mg | ORAL_TABLET | Freq: Every evening | ORAL | Status: DC | PRN
  Administered 2024-06-10 – 2024-06-11 (×2): 150 mg via ORAL
  Filled 2024-06-10: qty 1

## 2024-06-10 MED ORDER — SODIUM CHLORIDE 0.9 % IV SOLN: 250.0000 mL | INTRAVENOUS | Status: AC | PRN

## 2024-06-10 MED ORDER — INSULIN ASPART 100 UNIT/ML IJ SOLN
0.0000 [IU] | Freq: Three times a day (TID) | INTRAMUSCULAR | Status: DC
  Administered 2024-06-10 – 2024-06-11 (×2): 1 [IU] via SUBCUTANEOUS

## 2024-06-10 MED ORDER — CLONAZEPAM 0.5 MG PO TABS: 0.5000 mg | ORAL_TABLET | Freq: Two times a day (BID) | ORAL | Status: DC | PRN

## 2024-06-10 MED ORDER — SODIUM CHLORIDE 0.9% FLUSH
3.0000 mL | Freq: Two times a day (BID) | INTRAVENOUS | Status: DC
  Administered 2024-06-10 – 2024-06-12 (×4): 3 mL via INTRAVENOUS

## 2024-06-10 MED ORDER — ALUM & MAG HYDROXIDE-SIMETH 200-200-20 MG/5ML PO SUSP: 15.0000 mL | Freq: Four times a day (QID) | ORAL | Status: DC | PRN

## 2024-06-10 MED ORDER — AMLODIPINE BESYLATE 10 MG PO TABS
10.0000 mg | ORAL_TABLET | Freq: Every day | ORAL | Status: DC
  Administered 2024-06-10 – 2024-06-12 (×3): 10 mg via ORAL
  Filled 2024-06-10 (×3): qty 1

## 2024-06-10 NOTE — Discharge Instructions (Signed)
 Discharge Instructions:  1. You may shower, please wash incisions daily with soap and water and keep dry.  If you wish to cover wounds with dressing you may do so but please keep clean and change daily.  No tub baths or swimming until incisions have completely healed.  If your incisions become red or develop any drainage please call our office at [phone]  2. No Driving until cleared by Dr. Sharee Pimple office and you are no longer using narcotic pain medications  3. Monitor your weight daily.. Please use the same scale and weigh at same time... If you gain 5-10 lbs in 48 hours with associated lower extremity swelling, please contact our office at [phone]  4. Fever of 101.5 for at least 24 hours with no source, please contact our office at [phone]  5. Activity- up as tolerated, please walk at least 3 times per day.  Avoid strenuous activity, no lifting, pushing, or pulling with your arms over 8-10 lbs for a minimum of 6 weeks  6. If any questions or concerns arise, please do not hesitate to contact our office at [phone]

## 2024-06-10 NOTE — Progress Notes (Signed)
 3 Days Post-Op Procedure(s) (LRB): AORTIC VALVE REPLACEMENT USING INSPIRIS RESILIA AORTIC VALVE SIZE 23 MM (N/A) ECHOCARDIOGRAM, TRANSESOPHAGEAL, INTRAOPERATIVE (N/A) Subjective: No complaints this AM Just back from a walk  Objective: Vital signs in last 24 hours: Temp:  [97.5 F (36.4 C)-99 F (37.2 C)] 99 F (37.2 C) (06/22 0800) Pulse Rate:  [54-69] 62 (06/22 0800) Cardiac Rhythm: Normal sinus rhythm;Sinus bradycardia (06/22 0800) Resp:  [11-26] 19 (06/22 0800) BP: (94-145)/(48-74) 131/67 (06/22 0800) SpO2:  [90 %-98 %] 93 % (06/22 0800) Weight:  [32 kg] 67 kg (06/22 0236)  Hemodynamic parameters for last 24 hours:    Intake/Output from previous day: 06/21 0701 - 06/22 0700 In: 601.3 [P.O.:596; I.V.:5.3] Out: 200 [Urine:200] Intake/Output this shift: No intake/output data recorded.  General appearance: alert, cooperative, and no distress Neurologic: intact Heart: regular rate and rhythm Lungs: diminished breath sounds bibasilar Abdomen: normal findings: soft, non-tender Wound: clean and intact  Lab Results: Recent Labs    06/09/24 0602 06/10/24 0436  WBC 11.7* 10.8*  HGB 9.8* 9.8*  HCT 30.7* 30.6*  PLT 192 221   BMET:  Recent Labs    06/09/24 0602 06/10/24 0436  NA 135 137  K 4.3 4.0  CL 104 106  CO2 25 23  GLUCOSE 113* 100*  BUN 8 9  CREATININE 0.47 0.49  CALCIUM 7.9* 8.2*    PT/INR:  Recent Labs    06/07/24 1147  LABPROT 17.3*  INR 1.4*   ABG    Component Value Date/Time   PHART 7.329 (L) 06/07/2024 2001   HCO3 23.2 06/07/2024 2001   TCO2 25 06/07/2024 2001   ACIDBASEDEF 3.0 (H) 06/07/2024 2001   O2SAT 99 06/07/2024 2001   CBG (last 3)  Recent Labs    06/10/24 0018 06/10/24 0312 06/10/24 0839  GLUCAP 120* 107* 132*    Assessment/Plan: S/P Procedure(s) (LRB): AORTIC VALVE REPLACEMENT USING INSPIRIS RESILIA AORTIC VALVE SIZE 23 MM (N/A) ECHOCARDIOGRAM, TRANSESOPHAGEAL, INTRAOPERATIVE (N/A) Plan for transfer to step-down: see  transfer orders POD # 3 Looks great this AM CV- in SR, has not paced in 24 hours- dc EPW  BP better- will restart Norvasc  No beta blocker due to bradycardia  ASA for tissue valve RESP- CXR shows bilateral effusions + atelectasis  IS, diuresis RENAL- creatinine and lytes Ok  Diurese with PO Lasix ENDO- CBG well controlled GI- tolerating diet Continue cardiac rehab SCD + enoxaparin    LOS: 3 days    Charlene Barnes 06/10/2024

## 2024-06-11 ENCOUNTER — Other Ambulatory Visit: Payer: Self-pay | Admitting: Physician Assistant

## 2024-06-11 ENCOUNTER — Inpatient Hospital Stay (HOSPITAL_COMMUNITY)

## 2024-06-11 DIAGNOSIS — Z952 Presence of prosthetic heart valve: Secondary | ICD-10-CM

## 2024-06-11 LAB — CBC
HCT: 32.8 % — ABNORMAL LOW (ref 36.0–46.0)
Hemoglobin: 10.7 g/dL — ABNORMAL LOW (ref 12.0–15.0)
MCH: 29.7 pg (ref 26.0–34.0)
MCHC: 32.6 g/dL (ref 30.0–36.0)
MCV: 91.1 fL (ref 80.0–100.0)
Platelets: 279 10*3/uL (ref 150–400)
RBC: 3.6 MIL/uL — ABNORMAL LOW (ref 3.87–5.11)
RDW: 15.4 % (ref 11.5–15.5)
WBC: 10.5 10*3/uL (ref 4.0–10.5)
nRBC: 0 % (ref 0.0–0.2)

## 2024-06-11 LAB — BASIC METABOLIC PANEL WITH GFR
Anion gap: 6 (ref 5–15)
BUN: 8 mg/dL (ref 8–23)
CO2: 26 mmol/L (ref 22–32)
Calcium: 8.4 mg/dL — ABNORMAL LOW (ref 8.9–10.3)
Chloride: 106 mmol/L (ref 98–111)
Creatinine, Ser: 0.6 mg/dL (ref 0.44–1.00)
GFR, Estimated: >60 mL/min (ref >60)
Glucose, Bld: 108 mg/dL — ABNORMAL HIGH (ref 70–99)
Potassium: 3.8 mmol/L (ref 3.5–5.1)
Sodium: 138 mmol/L (ref 135–145)

## 2024-06-11 LAB — GLUCOSE, CAPILLARY
Glucose-Capillary: 96 mg/dL (ref 70–99)
Glucose-Capillary: 105 mg/dL — ABNORMAL HIGH (ref 70–99)
Glucose-Capillary: 116 mg/dL — ABNORMAL HIGH (ref 70–99)
Glucose-Capillary: 122 mg/dL — ABNORMAL HIGH (ref 70–99)
Glucose-Capillary: 115 mg/dL — ABNORMAL HIGH (ref 70–99)

## 2024-06-11 NOTE — Progress Notes (Signed)
 Mobility Specialist Progress Note:    06/11/24 1035  Mobility  Activity Ambulated independently in hallway  Level of Assistance Independent  Assistive Device None  Distance Ambulated (ft) 970 ft  Range of Motion/Exercises Active  RUE Weight Bearing Per Provider Order WBAT  LUE Weight Bearing Per Provider Order WBAT  Activity Response Tolerated well  Mobility Referral Yes  Mobility visit 1 Mobility  Mobility Specialist Start Time (ACUTE ONLY) 1030  Mobility Specialist Stop Time (ACUTE ONLY) 1037  Mobility Specialist Time Calculation (min) (ACUTE ONLY) 7 min   Pt received ambulating in room, husband at bedside. Agreeable to ambulate in hallways. No AD required. Tolerated well, asx throughout. Returned pt to room, all needs met.    Bethanny Toelle Mobility Specialist Please contact via Special educational needs teacher or  Rehab office at [phone]

## 2024-06-11 NOTE — Progress Notes (Addendum)
 4 Days Post-Op Procedure(s) (LRB): AORTIC VALVE REPLACEMENT USING INSPIRIS RESILIA AORTIC VALVE SIZE 23 MM (N/A) ECHOCARDIOGRAM, TRANSESOPHAGEAL, INTRAOPERATIVE (N/A) Subjective: Feeling better, ambulating well w/walker, + BM  Objective: Vital signs in last 24 hours: Temp:  [97.9 F (36.6 C)-99.5 F (37.5 C)] 99.5 F (37.5 C) (06/23 0357) Pulse Rate:  [57-72] 67 (06/23 0357) Cardiac Rhythm: Normal sinus rhythm (06/22 1900) Resp:  [14-24] 17 (06/23 0357) BP: (103-132)/(52-84) 122/73 (06/23 0357) SpO2:  [90 %-96 %] 94 % (06/23 0357) Weight:  [67.2 kg] 67.2 kg (06/23 0401)  Hemodynamic parameters for last 24 hours:    Intake/Output from previous day: 06/22 0701 - 06/23 0700 In: 360 [P.O.:360] Out: -  Intake/Output this shift: No intake/output data recorded.  General appearance: alert, cooperative, and no distress Heart: regular rate and rhythm and faint syst murmur Lungs: mi dim in bases Abdomen: benign Extremities: no edema Wound: incis healing well  Lab Results: Recent Labs    06/10/24 0436 06/11/24 0422  WBC 10.8* 10.5  HGB 9.8* 10.7*  HCT 30.6* 32.8*  PLT 221 279   BMET:  Recent Labs    06/10/24 0436 06/11/24 0422  NA 137 138  K 4.0 3.8  CL 106 106  CO2 23 26  GLUCOSE 100* 108*  BUN 9 8  CREATININE 0.49 0.60  CALCIUM 8.2* 8.4*    PT/INR: No results for input(s): LABPROT, INR in the last 72 hours. ABG    Component Value Date/Time   PHART 7.329 (L) 06/07/2024 2001   HCO3 23.2 06/07/2024 2001   TCO2 25 06/07/2024 2001   ACIDBASEDEF 3.0 (H) 06/07/2024 2001   O2SAT 99 06/07/2024 2001   CBG (last 3)  Recent Labs    06/10/24 1807 06/10/24 2113 06/11/24 0629  GLUCAP 140* 121* 96    Meds Scheduled Meds:  acetaminophen   1,000 mg Oral Q6H   Or   acetaminophen  (TYLENOL ) oral liquid 160 mg/5 mL  1,000 mg Per Tube Q6H   amLODipine  10 mg Oral Daily   aspirin  EC  325 mg Oral Daily   Or   aspirin   324 mg Per Tube Daily   bisacodyl   10 mg  Oral Daily   Or   bisacodyl   10 mg Rectal Daily   Chlorhexidine  Gluconate Cloth  6 each Topical Daily   docusate sodium   200 mg Oral Daily   enoxaparin  (LOVENOX ) injection  40 mg Subcutaneous QHS   furosemide  40 mg Oral Daily   insulin  aspart  0-9 Units Subcutaneous TID WC   pantoprazole   40 mg Oral Daily   PARoxetine   20 mg Oral Daily   potassium chloride   20 mEq Oral Daily   pramipexole   2.25 mg Oral QHS   sodium chloride  flush  3 mL Intravenous Q12H   Continuous Infusions:  sodium chloride      PRN Meds:.sodium chloride , alum & mag hydroxide-simeth, clonazePAM, ketorolac , magnesium  hydroxide, ondansetron  (ZOFRAN ) IV, mouth rinse, oxyCODONE , sodium chloride  flush, traMADol , traZODone   Xrays DG Chest Port 1 View Result Date: 06/10/2024 CLINICAL DATA:  Atelectasis.  Status post aortic valve repair. EXAM: PORTABLE CHEST 1 VIEW COMPARISON:  06/09/2024 FINDINGS: Status post median sternotomy and aortic valve replacement. 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 Heart size and mediastinal contours are stable. Pulmonary vascular congestion and bilateral pleural effusions. Unchanged compared with previous exam. IMPRESSION: No change in pulmonary vascular congestion and bilateral pleural effusions. Electronically Signed   By: Waddell Joan HERO.D.  On: 06/10/2024 07:59    Assessment/Plan: S/P Procedure(s) (LRB): AORTIC VALVE REPLACEMENT USING INSPIRIS RESILIA AORTIC VALVE SIZE 23 MM (N/A) ECHOCARDIOGRAM, TRANSESOPHAGEAL, INTRAOPERATIVE (N/A) POD#4  1 afeb, VSS, sinus rhythm, on norvasc, no beta blocker w/ bradycardia earlier post op  , ASA for valve 2 O2 sats good on RA 3 weight up about 4 KG, good UOP- not measured- cont daily lasix for now 4 normal renal fxn 5 reactive leukocytosis- resolved 6 expected ABLA- stable, minor 7 BS well controlled 8 CXR - minor bilat pleural  effus 9 cont pulm hygiene and rehab 10 likely home in am if no new issues    LOS: 4 days     Lemond FORBES Cera PA-C Pager [phone] 06/11/2024   Chart reviewed, patient examined, agree with above.  She is doing well overall. Plan home tomorrow if no changes.

## 2024-06-11 NOTE — Plan of Care (Signed)

## 2024-06-11 NOTE — Progress Notes (Signed)
 CARDIAC REHAB PHASE I   Pt resting in bed, feeling well today. Has ambulated in hallway 2 times today. Reports tolerating well with no pain, SOB or dizziness. Post OHS education including site care, restrictions, heart healthy diet, sternal precautions, IS use at home, home needs at discharge, exercise guidelines and CRP2 reviewed. All questions and concerns addressed. Will refer to [facility] for CRP2. Will continue to follow.  [id number] Vaughn Asberry Hacking, RN BSN 06/11/2024 12:08 PM

## 2024-06-12 LAB — BASIC METABOLIC PANEL WITH GFR
Anion gap: 8 (ref 5–15)
BUN: 14 mg/dL (ref 8–23)
CO2: 25 mmol/L (ref 22–32)
Calcium: 8.7 mg/dL — ABNORMAL LOW (ref 8.9–10.3)
Chloride: 106 mmol/L (ref 98–111)
Creatinine, Ser: 0.61 mg/dL (ref 0.44–1.00)
GFR, Estimated: >60 mL/min (ref >60)
Glucose, Bld: 122 mg/dL — ABNORMAL HIGH (ref 70–99)
Potassium: 3.9 mmol/L (ref 3.5–5.1)
Sodium: 139 mmol/L (ref 135–145)

## 2024-06-12 LAB — MAGNESIUM: Magnesium: 1.8 mg/dL (ref 1.7–2.4)

## 2024-06-12 LAB — GLUCOSE, CAPILLARY
Glucose-Capillary: 92 mg/dL (ref 70–99)
Glucose-Capillary: 100 mg/dL — ABNORMAL HIGH (ref 70–99)

## 2024-06-12 MED ORDER — TRAMADOL HCL 50 MG PO TABS: 50.0000 mg | ORAL_TABLET | Freq: Four times a day (QID) | ORAL | 0 refills | Status: AC | PRN

## 2024-06-12 MED ORDER — MAGNESIUM GLUCONATE 500 (27 MG) MG PO TABS
500.0000 mg | ORAL_TABLET | Freq: Every day | ORAL | Status: DC
  Administered 2024-06-12: 500 mg via ORAL
  Filled 2024-06-12: qty 1

## 2024-06-12 MED ORDER — ASPIRIN 325 MG PO TBEC: 325.0000 mg | DELAYED_RELEASE_TABLET | Freq: Every day | ORAL | Status: DC

## 2024-06-12 MED ORDER — MAGNESIUM GLUCONATE 500 (27 MG) MG PO TABS: 500.0000 mg | ORAL_TABLET | Freq: Every day | ORAL | 0 refills | Status: AC

## 2024-06-12 NOTE — Plan of Care (Signed)
  Problem: Clinical Measurements: Goal: Will remain free from infection Outcome: Progressing   Problem: Coping: Goal: Level of anxiety will decrease Outcome: Progressing   Problem: Elimination: Goal: Will not experience complications related to bowel motility Outcome: Progressing Goal: Will not experience complications related to urinary retention Outcome: Progressing   Problem: Skin Integrity: Goal: Risk for impaired skin integrity will decrease Outcome: Progressing   Problem: Safety: Goal: Ability to remain free from injury will improve Outcome: Progressing   Problem: Cardiac: Goal: Will achieve and/or maintain hemodynamic stability Outcome: Progressing   Problem: Respiratory: Goal: Respiratory status will improve Outcome: Progressing

## 2024-06-12 NOTE — TOC Transition Note (Signed)
 Transition of Care (TOC) - Discharge Note Rayfield Gobble RN, BSN Transitions of Care Unit 4E- RN Case Manager See Treatment Team for direct phone #   Patient Details  Name: Charlene Barnes MRN: [id number] Date of Birth: 26-Sep-1946  Transition of Care [facility]) CM/SW Contact:  Gobble Rayfield Hurst, RN Phone Number: 06/12/2024, 12:04 PM   Clinical Narrative:    Pt stable for transition home today, Spouse to transport home.  Noted TCTS office made referral to [facility] for any HH needs. Per discussion in progression- no HH needs noted. [facility] liaison updated.   Pt independent in room, no DME needs noted.    Final next level of care: Home/Self Care Barriers to Discharge: Barriers Resolved   Patient Goals and CMS Choice Patient states their goals for this hospitalization and ongoing recovery are:: wants to go home CMS Medicare.gov Compare Post Acute Care list provided to:: Patient Represenative (must comment) Choice offered to / list presented to : Adult Children      Discharge Placement                 Home      Discharge Plan and Services Additional resources added to the After Visit Summary for     Discharge Planning Services: CM Consult Post Acute Care Choice: Home Health          DME Arranged: N/A DME Agency: NA       HH Arranged: NA HH Agency: [facility] Date HH Agency Contacted: 06/12/24   Representative spoke with at [facility] Agency: Zebedee  Social Drivers of Health (SDOH) Interventions SDOH Screenings   Food Insecurity: Patient Unable To Answer (06/07/2024)  Housing: Unknown (06/07/2024)  Transportation Needs: Patient Unable To Answer (06/07/2024)  Utilities: Patient Unable To Answer (06/07/2024)  Financial Resource Strain: Low Risk  (05/25/2022)   Received from [facility]  Physical Activity: Insufficiently Active (05/25/2022)   Received from [facility]  Social Connections: Unknown (06/07/2024)  Stress: No Stress Concern Present  (05/25/2022)   Received from [facility]  Tobacco Use: Unknown (06/07/2024)     Readmission Risk Interventions    06/12/2024   12:04 PM  Readmission Risk Prevention Plan  Post Dischage Appt Complete  Medication Screening Complete  Transportation Screening Complete

## 2024-06-12 NOTE — Progress Notes (Signed)
 Mobility Specialist Progress Note:    06/12/24 1214  Mobility  Activity Ambulated with assistance in hallway;Ambulated with assistance to bathroom  Level of Assistance Contact guard assist, steadying assist  Assistive Device None  Distance Ambulated (ft) 410 ft  RUE Weight Bearing Per Provider Order WBAT  LUE Weight Bearing Per Provider Order WBAT  Activity Response Tolerated well  Mobility Referral Yes  Mobility visit 1 Mobility  Mobility Specialist Start Time (ACUTE ONLY) 1157  Mobility Specialist Stop Time (ACUTE ONLY) 1213  Mobility Specialist Time Calculation (min) (ACUTE ONLY) 16 min   Received pt in bed agreeable to mobility. Pt requested to go to the BR before ambulating the hall. Pt voided. C/o,head feeling heavy, but otherwise no complaints. Returned to room w/o fault. Personal belongings and call light within reach. All needs met.   Lavanda Pollack Mobility Specialist  Please contact via [employer] or  Rehab Office [phone]

## 2024-06-12 NOTE — Care Management Important Message (Signed)
 Important Message  Patient Details  Name: Charlene Barnes MRN: [id number] Date of Birth: [birth year]   Important Message Given:  Yes - Medicare IM     Vonzell Arrie Sharps 06/12/2024, 10:17 AM

## 2024-06-12 NOTE — Progress Notes (Signed)
 Discharge instructions provided to the patient, educated regarding wound care and sternal precaution, PIV removed, CCMD notified, no any concerns, pt took all the belongings. NO any DME needed.

## 2024-06-12 NOTE — Progress Notes (Signed)
 CARDIAC REHAB PHASE I   PRE:  Rate/Rhythm: 63 SR   BP:  Sitting: 119/64      SaO2: 98 RA   MODE:  Ambulation: 940 ft   POST:  Rate/Rhythm: 88 SR      Pt ambulated in hallway, using front wheel walker. Tolerated well with no pain, SOB or dizziness. Only complaint, feeling tired. Pt assisted to bathroom, left with call bell in reach. Will call for assistance when ready. Will continue to follow.   [phone]    Vaughn Asberry Hacking, RN BSN 06/12/2024 10:29 AM

## 2024-06-12 NOTE — Progress Notes (Addendum)
 5 Days Post-Op Procedure(s) (LRB): AORTIC VALVE REPLACEMENT USING INSPIRIS RESILIA AORTIC VALVE SIZE 23 MM (N/A) ECHOCARDIOGRAM, TRANSESOPHAGEAL, INTRAOPERATIVE (N/A) Subjective: Feels well, did have a little dizziness this am, not sure if coincided w/ PVC's  Objective: Vital signs in last 24 hours: Temp:  [98 F (36.7 C)-98.7 F (37.1 C)] 98.5 F (36.9 C) (06/24 0304) Pulse Rate:  [57-71] 60 (06/24 0304) Cardiac Rhythm: Normal sinus rhythm (06/23 2030) Resp:  [13-20] 17 (06/24 0304) BP: (97-137)/(49-71) 97/49 (06/24 0304) SpO2:  [90 %-98 %] 90 % (06/24 0304) Weight:  [64 kg] 64 kg (06/24 0500)  Hemodynamic parameters for last 24 hours:    Intake/Output from previous day: No intake/output data recorded. Intake/Output this shift: No intake/output data recorded.  General appearance: alert, cooperative, and no distress Heart: regular rate and rhythm Lungs: clear to auscultation bilaterally Abdomen: benign Extremities: no edema Wound: incis healing well  Lab Results: Recent Labs    06/10/24 0436 06/11/24 0422  WBC 10.8* 10.5  HGB 9.8* 10.7*  HCT 30.6* 32.8*  PLT 221 279   BMET:  Recent Labs    06/10/24 0436 06/11/24 0422  NA 137 138  K 4.0 3.8  CL 106 106  CO2 23 26  GLUCOSE 100* 108*  BUN 9 8  CREATININE 0.49 0.60  CALCIUM 8.2* 8.4*    PT/INR: No results for input(s): LABPROT, INR in the last 72 hours. ABG    Component Value Date/Time   PHART 7.329 (L) 06/07/2024 2001   HCO3 23.2 06/07/2024 2001   TCO2 25 06/07/2024 2001   ACIDBASEDEF 3.0 (H) 06/07/2024 2001   O2SAT 99 06/07/2024 2001   CBG (last 3)  Recent Labs    06/11/24 1534 06/11/24 2125 06/12/24 0609  GLUCAP 122* 115* 92    Meds Scheduled Meds:  acetaminophen   1,000 mg Oral Q6H   Or   acetaminophen  (TYLENOL ) oral liquid 160 mg/5 mL  1,000 mg Per Tube Q6H   amLODipine  10 mg Oral Daily   aspirin  EC  325 mg Oral Daily   Or   aspirin   324 mg Per Tube Daily   bisacodyl   10 mg  Oral Daily   Or   bisacodyl   10 mg Rectal Daily   Chlorhexidine  Gluconate Cloth  6 each Topical Daily   docusate sodium   200 mg Oral Daily   enoxaparin  (LOVENOX ) injection  40 mg Subcutaneous QHS   furosemide  40 mg Oral Daily   insulin  aspart  0-9 Units Subcutaneous TID WC   pantoprazole   40 mg Oral Daily   PARoxetine   20 mg Oral Daily   potassium chloride   20 mEq Oral Daily   pramipexole   2.25 mg Oral QHS   sodium chloride  flush  3 mL Intravenous Q12H   Continuous Infusions: PRN Meds:.alum & mag hydroxide-simeth, clonazePAM, ketorolac , magnesium  hydroxide, ondansetron  (ZOFRAN ) IV, mouth rinse, oxyCODONE , sodium chloride  flush, traMADol , traZODone   Xrays DG Chest 2 View Result Date: 06/11/2024 CLINICAL DATA:  Status post aortic valve replacement EXAM: CHEST - 2 VIEW COMPARISON:  Yesterday FINDINGS: Numerous leads and wires project over the chest. Median sternotomy for aortic valve repair. Midline trachea. Mild cardiomegaly. Tortuous thoracic aorta. Small bilateral pleural effusions are similar. No pneumothorax. No congestive failure.  Improvement in bibasilar atelectasis. IMPRESSION: Persistent small bilateral pleural effusions and improved bibasilar atelectasis. Resolved congestive heart failure. Electronically Signed   By: Rockey Kilts M.D.   On: 06/11/2024 11:09    Assessment/Plan: S/P Procedure(s) (LRB): AORTIC VALVE REPLACEMENT USING INSPIRIS  RESILIA AORTIC VALVE SIZE 23 MM (N/A) ECHOCARDIOGRAM, TRANSESOPHAGEAL, INTRAOPERATIVE (N/A)  POD#5  1 afeb, VSS, sinus rhythm, has had some vent bigeminy this am, not on beta blocker w/ early post op bradycardia, had a little dizziness 2 O2 sats good on RA 3 weight about at preop, voiding- not measured 4 BS well controlled 5 no new labs or xray's- will check K+ and Mg++ this am 6 will d/w MD - poss home today    LOS: 5 days    Lemond FORBES Cera PA-C Pager [phone] 06/12/2024   Chart reviewed, patient examined, agree with above.   She feels well overall. Brief bigeminy this am which is no problem. K+ and Mg++ levels pending this am. I think she can go home today. Would not put on beta blocker with her baseline HR in the 60's.

## 2024-06-14 MED FILL — Mannitol IV Soln 20%: INTRAVENOUS | Qty: 500 | Status: AC

## 2024-06-14 MED FILL — Sodium Chloride IV Soln 0.9%: INTRAVENOUS | Qty: 2000 | Status: AC

## 2024-06-14 MED FILL — Electrolyte-R (PH 7.4) Solution: INTRAVENOUS | Qty: 3000 | Status: AC

## 2024-06-14 MED FILL — Potassium Chloride Inj 2 mEq/ML: INTRAVENOUS | Qty: 40 | Status: AC

## 2024-06-14 MED FILL — Heparin Sodium (Porcine) Inj 1000 Unit/ML: Qty: 1000 | Status: AC

## 2024-06-14 MED FILL — Sodium Bicarbonate IV Soln 8.4%: INTRAVENOUS | Qty: 50 | Status: AC

## 2024-06-14 MED FILL — Lidocaine HCl Local Preservative Free (PF) Inj 2%: INTRAMUSCULAR | Qty: 14 | Status: AC

## 2024-06-14 MED FILL — Heparin Sodium (Porcine) Inj 1000 Unit/ML: INTRAMUSCULAR | Qty: 10 | Status: AC

## 2024-06-18 ENCOUNTER — Ambulatory Visit: Attending: Surgery

## 2024-06-18 DIAGNOSIS — Z4802 Encounter for removal of sutures: Secondary | ICD-10-CM

## 2024-06-19 ENCOUNTER — Telehealth: Payer: Self-pay | Admitting: *Deleted

## 2024-06-19 NOTE — Telephone Encounter (Signed)
 Patient contacted the office inquiring about her amlodipine . States she has not taken since hospital discharge because her BP's have been low. Recent VS readings- 107/63 HR 62, 109/54 HR 41, 116/68 HR 58, 107/63 HR 65. Per B. Chambers, Charlene Barnes, patient advised she may stay off the medication until follow up appt.

## 2024-06-26 ENCOUNTER — Ambulatory Visit: Attending: Physician Assistant | Admitting: Physician Assistant

## 2024-06-26 ENCOUNTER — Encounter: Payer: Self-pay | Admitting: Physician Assistant

## 2024-06-26 VITALS — BP 142/82 | HR 53 | Ht 64.0 in | Wt 140.4 lb

## 2024-06-26 DIAGNOSIS — I10 Essential (primary) hypertension: Secondary | ICD-10-CM

## 2024-06-26 DIAGNOSIS — Z952 Presence of prosthetic heart valve: Secondary | ICD-10-CM | POA: Diagnosis not present

## 2024-06-26 NOTE — Progress Notes (Unsigned)
 Cardiology Office Note   Date:  06/26/2024  ID:  Charlene, Barnes [birth year], MRN [id number] PCP: Tisovec, Richard W, MD  [facility] Providers Cardiologist:  None { Click to update primary MD,subspecialty MD or APP then REFRESH:1}    History of Present Illness Charlene Barnes is a 78 y.o. female with past medical history of severe AI status post AVR.  Patient developed worsening dyspnea with exertion for the past 6 months.  Echocardiogram subsequently showed moderate to severe aortic regurgitation.  Outpatient TEE performed on 04/06/2024 showed EF 50 to 55%, moderate to severe AI.  Cardiac catheterization performed on the same day showed normal coronary arteries.She ultimately underwent bioprosthetic AVR by Dr. Lucas on 06/07/2024 with placement of a 23 mm Edwards Inspiris Resilia pericardial valve.  She had borderline bradycardia at times, therefore beta-blocker was held.  She had expected volume overload after the surgery require diuresis.  Presurgical hemoglobin A1c was 5.4.  Chest x-ray in the hospital showed persistent small bilateral pleural effusion, improved bibasilar atelectasis.  Patient presents to the clinic today for posthospital follow-up.  Her sternotomy scar is well-healed.  She does not have any lower extremity edema, orthopnea or PND.  Outpatient echocardiogram is scheduled.  Blood pressure is elevated today, patient was previously on amlodipine  and olmesartan prior to the recent surgery.  Amlodipine  was stopped in the past few days as her blood pressure has been persistently in the 100 range despite not taking the amlodipine .  However in the past 2 days, her blood pressure has bounced back.  I asked her to restart amlodipine  at 5 mg daily.  After 2 weeks if systolic blood pressure is greater than 140 mmHg, she is to increase amlodipine  to 10 mg daily.  I will plan to see the patient back in 6 weeks for reassessment.  She can follow-up with Dr. Wadie in 4 months.  She also  requested to start cardiac rehab at [facility] location.  ROS: ***  Studies Reviewed      *** Risk Assessment/Calculations {Does this patient have ATRIAL FIBRILLATION?:[id number]} The patient's 1st BP is elevated (>139/89)*** Repeat BP and {Click to enter a 2nd BP Refresh Note  :1}       Physical Exam VS:  BP (!) 142/82   Pulse (!) 53   Ht 5' 4 (1.626 m)   Wt 140 lb 6.4 oz (63.7 kg)   SpO2 97%   BMI 24.10 kg/m        Wt Readings from Last 3 Encounters:  06/26/24 140 lb 6.4 oz (63.7 kg)  06/12/24 141 lb 1.6 oz (64 kg)  06/06/24 139 lb 12.8 oz (63.4 kg)    GEN: Well nourished, well developed in no acute distress NECK: No JVD; No carotid bruits CARDIAC: ***RRR, no murmurs, rubs, gallops RESPIRATORY:  Clear to auscultation without rales, wheezing or rhonchi  ABDOMEN: Soft, non-tender, non-distended EXTREMITIES:  No edema; No deformity   ASSESSMENT AND PLAN *** {The patient has an active order for outpatient cardiac rehabilitation.   Please indicate if the patient is ready to start. Do NOT delete this.  It will auto delete.  Refresh note, then sign.              Click here to document readiness and see contraindications.  :1}  Cardiac Rehabilitation Eligibility Assessment      {Are you ordering a CV Procedure (e.g. stress test, cath, DCCV, TEE, etc)?   Press F2        :  [id number]}  Dispo: ***  Signed, Jakarius Flamenco, PA

## 2024-06-26 NOTE — Patient Instructions (Signed)
 Medication Instructions:  RESTART AMLODIPINE  5MG  DAILY; IF AFTER 2 WEEKS IF BLOOD PRESSURE IS STILL GREATER THAN 140 INCREASE TO 10 MG DAILY *If you need a refill on your cardiac medications before your next appointment, please call your pharmacy*  Lab Work: NO LABS If you have labs (blood work) drawn today and your tests are completely normal, you will receive your results only by: MyChart Message (if you have MyChart) OR A paper copy in the mail If you have any lab test that is abnormal or we need to change your treatment, we will call you to review the results.  Testing/Procedures: NO TESTING  Follow-Up: At [facility], you and your health needs are our priority.  As part of our continuing mission to provide you with exceptional heart care, our providers are all part of one team.  This team includes your primary Cardiologist (physician) and Advanced Practice Providers or APPs (Physician Assistants and Nurse Practitioners) who all work together to provide you with the care you need, when you need it.  Your next appointment:   6 week(s)  Provider:   Hao Meng, PA-C      Then, Dorn Lesches, MD will plan to see you again in 4 month(s).

## 2024-06-27 ENCOUNTER — Telehealth (HOSPITAL_COMMUNITY): Payer: Self-pay

## 2024-06-27 NOTE — Telephone Encounter (Signed)
 Called patient in regards to cardiac rehab to go over program, patient stated she wants to go to cardiac rehab in [place]. Informed patient we do not have one listed for [place] but offered to send her referral to one of the locations in [place]. She stated she does not want to go to [facility] and will do her own research to see if there is a cardiac rehab program in [place]. If she cannot get in to a cardiac rehab program there she will come to rehab here in [place].   Patient is aware she needs to complete her f/u appt on 7/15.

## 2024-06-28 NOTE — Progress Notes (Signed)
 HPI:  [name] is a 78 year old female with a history of hypertension, dyslipidemia, restless leg syndrome, and severe aortic insufficiency.  She had bioprosthetic aortic valve replacement by Dr. Lucas on 06/07/2024 with a 23 mm Inspiris Resilia valve.  Postoperative course was uneventful except for some bradycardia.  She was not discharged on a beta-blocker for this reason.  She has been seen in follow-up by cardiology and was progressing well at that time.  Amlodipine  was reduced from 10 mg to 5 mg daily for relative hypotension.  She returns today for scheduled follow-up. Since discharge, [name] has continued to make steady progress.  She is walking at least 15 minutes twice a day.  She denies having any shortness of breath or palpitations.  She does not use any pain medication during the day but continues to take tramadol  at night to help her rest.  She would like to begin cardiac rehab.   Current Outpatient Medications  Medication Sig Dispense Refill   acetaminophen  (TYLENOL ) 650 MG CR tablet Take 1,300 mg by mouth every 8 (eight) hours as needed for pain.     alendronate (FOSAMAX) 70 MG tablet Take 70 mg by mouth once a week.     amLODipine  (NORVASC ) 10 MG tablet Take 10 mg by mouth daily.     aspirin  EC 325 MG tablet Take 1 tablet (325 mg total) by mouth daily.     b complex vitamins capsule Take 1 capsule by mouth daily.     clonazePAM  (KLONOPIN ) 0.5 MG tablet Take 0.5 mg by mouth 2 (two) times daily as needed for anxiety.     diclofenac (VOLTAREN) 75 MG EC tablet Take 75 mg by mouth 2 (two) times daily as needed for moderate pain (pain score 4-6).     diclofenac Sodium (VOLTAREN) 1 % GEL Apply 2 g topically as needed (back pain).     magnesium  gluconate (MAGONATE) 500 (27 Mg) MG TABS tablet Take 1 tablet (500 mg total) by mouth daily. 7 tablet 0   Multiple Vitamins-Minerals (MULTIVITAMIN WITH MINERALS) tablet Take 1 tablet by mouth daily.     PARoxetine  (PAXIL ) 20 MG tablet  Take 20 mg by mouth daily.     pramipexole  (MIRAPEX ) 1.5 MG tablet Take 2.25 mg by mouth at bedtime.     traZODone  (DESYREL ) 150 MG tablet Take 150 mg by mouth at bedtime as needed for sleep.     No current facility-administered medications for this visit.    Physical Exam Vital signs BP 126/77 Heart rate 72 Respirations 20 SpO2 96% on room air  General: Pleasant 78 year old female in no distress.  Skin is warm and dry.  She is accompanied by her husband today. Neck: No JVD Heart: Regular rate and rhythm, there is a soft systolic murmur consistent with the bioprosthetic aortic valve. Chest: Breath sounds are full, equal, and clear to auscultation.  The sternum is stable.  The sternal incision is healing with no sign of complication.  The chest x-ray was reviewed and shows continued clearing of the lung fields and resolution of the small effusions with basilar atelectasis.    Diagnostic Tests: CLINICAL DATA:  Aortic valve replacement   EXAM: CHEST - 2 VIEW   COMPARISON:  June 03/08/2024 and prior studies   FINDINGS: Improved aeration of the lungs with no focal consolidation. No pleural effusions. No pneumothorax.   Status post aortic valve repair with unchanged cardiomediastinal silhouette. Sternotomy wires are intact.   IMPRESSION: No active cardiopulmonary disease.  Electronically Signed   By: Michaeline Blanch M.D.   On: 07/03/2024 12:49  Impression / Plan: [name] is continuing to make a progressive and satisfactory recovery after bioprosthetic aortic valve replacement for severe aortic insufficiency and normal biventricular function.  Medications were reviewed and require no changes from CT surgery standpoint.  She appears euvolemic so no further diuresis is required.  The follow-up chest x-ray today shows continued improvement with resolution of the small bilateral effusions and further clearing of the lung fields.  She is scheduled for follow-up echocardiogram on  07/30/2024 and has additional follow-up scheduled with cardiology in September.  She would also like follow-up visit with Dr. Lucas after the echocardiogram so we will arrange this.  Referral was made to cardiac rehab in [facility] as she requested.  Will also provide 1 refill on the tramadol  as she requested. [name].  We reviewed sternal precautions.  Follow-up with Dr. Lucas in about 6 weeks   Laurel JUDITHANN Becket, PA-C [facility] [phone]

## 2024-06-29 ENCOUNTER — Other Ambulatory Visit: Payer: Self-pay | Admitting: Surgery

## 2024-06-29 DIAGNOSIS — I351 Nonrheumatic aortic (valve) insufficiency: Secondary | ICD-10-CM

## 2024-07-03 ENCOUNTER — Telehealth: Payer: Self-pay | Admitting: *Deleted

## 2024-07-03 ENCOUNTER — Ambulatory Visit: Attending: Surgery | Admitting: Physician Assistant

## 2024-07-03 ENCOUNTER — Encounter: Payer: Self-pay | Admitting: Physician Assistant

## 2024-07-03 ENCOUNTER — Ambulatory Visit (HOSPITAL_COMMUNITY)
Admission: RE | Admit: 2024-07-03 | Discharge: 2024-07-03 | Disposition: A | Discharge status: Home / Self Care | Source: Ambulatory Visit | Attending: Internal Medicine | Admitting: Internal Medicine

## 2024-07-03 VITALS — BP 126/77 | HR 72 | Resp 20 | Ht 64.0 in | Wt 141.0 lb

## 2024-07-03 DIAGNOSIS — I351 Nonrheumatic aortic (valve) insufficiency: Secondary | ICD-10-CM | POA: Insufficient documentation

## 2024-07-03 DIAGNOSIS — Z952 Presence of prosthetic heart valve: Secondary | ICD-10-CM

## 2024-07-03 MED ORDER — TRAMADOL HCL 50 MG PO TABS: 50.0000 mg | ORAL_TABLET | Freq: Two times a day (BID) | ORAL | 0 refills | Status: AC | PRN

## 2024-07-03 NOTE — Patient Instructions (Signed)
 You may resume driving.  No change in medications from CT surgery standpoint  May gradually increase activity but continue to observe sternal precautions with no lifting greater than 15 pounds for another 6 weeks.  After that, you may advance your activity without limitation.  Will schedule follow-up with Dr. Lucas in 4 to 6 weeks.

## 2024-07-03 NOTE — Addendum Note (Signed)
 Addended by: Eriyanna Kofoed G on: 07/03/2024 02:25 PM   Modules accepted: Orders

## 2024-07-03 NOTE — Telephone Encounter (Signed)
 Ambulatory referral faxed to [facility] program per EMERSON Becket, PA.

## 2024-07-20 ENCOUNTER — Telehealth: Payer: Self-pay

## 2024-07-20 NOTE — Telephone Encounter (Signed)
 [place], RN with [facility] contacted the office requesting verbal orders for home health nursing for 1w4. Start of care was initiated. Patient is s/p AVR 06/07/24 with Dr. Lucas. Verbal order given.

## 2024-07-30 ENCOUNTER — Ambulatory Visit (HOSPITAL_COMMUNITY)
Admission: RE | Admit: 2024-07-30 | Discharge: 2024-07-30 | Disposition: A | Discharge status: Home / Self Care | Source: Ambulatory Visit | Attending: Cardiology | Admitting: Cardiology

## 2024-07-30 ENCOUNTER — Encounter (HOSPITAL_COMMUNITY): Payer: Self-pay

## 2024-07-30 DIAGNOSIS — Z952 Presence of prosthetic heart valve: Secondary | ICD-10-CM | POA: Diagnosis present

## 2024-07-30 LAB — ECHOCARDIOGRAM LIMITED
AV Mean grad: 11 mmHg
AV Peak grad: 17.6 mmHg
Ao pk vel: 2.1 m/s
Area-P 1/2: 2.14 cm2
S' Lateral: 3.3 cm

## 2024-07-31 ENCOUNTER — Ambulatory Visit: Payer: Self-pay | Admitting: Physician Assistant

## 2024-07-31 NOTE — Progress Notes (Signed)
 Normal pumping function of heart, normal right chamber pressure, normal functioning bioprosthetic aortic valve.

## 2024-07-31 NOTE — Telephone Encounter (Signed)
 I call and spoke with Mrs. Balducci, the result of echocardiogram given over the phone. All questions answered. Thank you

## 2024-08-08 ENCOUNTER — Ambulatory Visit: Admitting: Surgery

## 2024-09-05 ENCOUNTER — Encounter: Payer: Self-pay | Admitting: Physician Assistant

## 2024-09-05 ENCOUNTER — Ambulatory Visit: Attending: Cardiology | Admitting: Physician Assistant

## 2024-09-05 VITALS — BP 110/64 | HR 56 | Ht 63.5 in | Wt 139.2 lb

## 2024-09-05 DIAGNOSIS — Z952 Presence of prosthetic heart valve: Secondary | ICD-10-CM | POA: Diagnosis not present

## 2024-09-05 NOTE — Patient Instructions (Signed)
 Medication Instructions:  Your physician recommends that you continue on your current medications as directed. Please refer to the Current Medication list given to you today. *If you need a refill on your cardiac medications before your next appointment, please call your pharmacy*  Lab Work: None Ordered If you have labs (blood work) drawn today and your tests are completely normal, you will receive your results only by: MyChart Message (if you have MyChart) OR A paper copy in the mail If you have any lab test that is abnormal or we need to change your treatment, we will call you to review the results.  Testing/Procedures: None ordered  Follow-Up: At [facility], you and your health needs are our priority.  As part of our continuing mission to provide you with exceptional heart care, our providers are all part of one team.  This team includes your primary Cardiologist (physician) and Advanced Practice Providers or APPs (Physician Assistants and Nurse Practitioners) who all work together to provide you with the care you need, when you need it.  Your next appointment:   FOLLOW UP AS SCHEDULED   Provider:   Dorn Lesches, MD    We recommend signing up for the patient portal called MyChart.  Sign up information is provided on this After Visit Summary.  MyChart is used to connect with patients for Virtual Visits (Telemedicine).  Patients are able to view lab/test results, encounter notes, upcoming appointments, etc.  Non-urgent messages can be sent to your provider as well.   To learn more about what you can do with MyChart, go to [email or web address].   Other Instructions

## 2024-09-05 NOTE — Progress Notes (Unsigned)
 Cardiology Office Note   Date:  09/05/2024  ID:  Kiandria, Clum [birth year], MRN [id number] PCP: Tisovec, Richard W, MD  [facility] Providers Cardiologist:  Dorn Lesches, MD { Click to update primary MD,subspecialty MD or APP then REFRESH:1}    History of Present Illness ROBY SPALLA is a 78 y.o. female with past medical history of severe AI s/p AVR.  Patient developed worsening dyspnea with exertion.  Echocardiogram subsequently showed moderate to severe aortic regurgitation.  Outpatient TEE performed on 04/06/2024 showed EF 50 to 55%, moderate to severe AI.  Cardiac catheterization performed on the same day showed normal coronary arteries. She ultimately underwent bioprosthetic AVR by Dr. Lucas on 06/07/2024 with placement of a 23 mm Edwards Inspiris Resilia pericardial valve.  She had borderline bradycardia at times, therefore beta-blocker was held.  She had expected volume overload after the surgery require diuresis.  Presurgical hemoglobin A1c was 5.4.  Chest x-ray in the hospital showed persistent small bilateral pleural effusion, improved bibasilar atelectasis.  I last saw the patient on 06/26/2024 at which time her blood pressure is elevated.  Her blood pressure was initially low after leaving the hospital but after the blood pressure became high, I decided to resume her 5 mg amlodipine .  I instructed the patient to increase amlodipine  to 10 mg if blood pressure is persistently greater than 140 mmHg after 2 weeks.  Repeat limited echocardiogram obtained on 07/30/2024 showed EF 55 to 60%, grade 1 DD, RVSP 23.4 mmHg, trivial MR and stable bioprosthetic aortic valve with no paravalvular leakage, normal functioning prosthetic valve.  Patient presents today for follow-up.  She is doing well from the cardiac perspective.  She used to have a small site of drainage in the lower sternotomy site, however this has stopped.  I examined the area, there is no significant erythema, hot or has  any active drainage.  I palpated the area and did not feel any abscess.  Given lack of any evidence of infection, I recommend continued observation.  Recent echocardiogram has been reviewed with the patient and showed well-seated bioprosthetic aortic valve.  She denies significant chest pain or shortness of breath.  She is doing very well with cardiac rehab.  I encouraged her to go back to pickleball which she used to enjoy prior to surgery.  She did ask whether or not she should come off of high-dose aspirin , I will discuss this with Dr. Wadie.  Otherwise, she can follow-up with Dr. Wadie in November as previously scheduled.  ROS: ***  Studies Reviewed      *** Risk Assessment/Calculations {Does this patient have ATRIAL FIBRILLATION?:[phone]}         Physical Exam VS:  BP 110/64 (BP Location: Left Arm, Patient Position: Sitting, Cuff Size: Normal)   Pulse (!) 56   Ht 5' 3.5 (1.613 m)   Wt 139 lb 3.2 oz (63.1 kg)   SpO2 91%   BMI 24.27 kg/m        Wt Readings from Last 3 Encounters:  09/05/24 139 lb 3.2 oz (63.1 kg)  07/03/24 141 lb (64 kg)  06/26/24 140 lb 6.4 oz (63.7 kg)    GEN: Well nourished, well developed in no acute distress NECK: No JVD; No carotid bruits CARDIAC: ***RRR, no murmurs, rubs, gallops RESPIRATORY:  Clear to auscultation without rales, wheezing or rhonchi  ABDOMEN: Soft, non-tender, non-distended EXTREMITIES:  No edema; No deformity   ASSESSMENT AND PLAN ***    {Are you ordering a CV  Procedure (e.g. stress test, cath, DCCV, TEE, etc)?   Press F2        :[id number]}  Dispo: ***  Signed, Scot Ford, PA

## 2024-09-13 NOTE — Progress Notes (Signed)
 Attempted to call patient no answer, left a detailed vm.  Patient never viewed in mychart. Printed, highlighted doctor comments and mailed to patient

## 2024-10-29 ENCOUNTER — Ambulatory Visit: Admitting: Cardiovascular Disease

## 2024-10-29 ENCOUNTER — Encounter: Payer: Self-pay | Admitting: Cardiovascular Disease

## 2024-10-29 ENCOUNTER — Ambulatory Visit: Attending: Cardiovascular Disease | Admitting: Cardiovascular Disease

## 2024-10-29 VITALS — BP 120/70 | HR 56 | Ht 63.5 in | Wt 135.0 lb

## 2024-10-29 DIAGNOSIS — I351 Nonrheumatic aortic (valve) insufficiency: Secondary | ICD-10-CM | POA: Diagnosis not present

## 2024-10-29 DIAGNOSIS — I10 Essential (primary) hypertension: Secondary | ICD-10-CM | POA: Diagnosis not present

## 2024-10-29 DIAGNOSIS — E782 Mixed hyperlipidemia: Secondary | ICD-10-CM | POA: Diagnosis not present

## 2024-10-29 DIAGNOSIS — Z952 Presence of prosthetic heart valve: Secondary | ICD-10-CM | POA: Diagnosis not present

## 2024-10-29 NOTE — Assessment & Plan Note (Signed)
 Charlene Barnes initially saw me because of severe aortic insufficiency.  This was confirmed on transesophageal echo.  I performed cardiac catheterization on her revealing clean coronary arteries and normal LVEDP.  She was symptomatic.  I referred her to Dr. Sherrine who performed aortic valve replacement 06/07/2024 with a 23 mm Edwards Inspiris Resilia pericardial valve.  Her postop course was uneventful.  She participated in cardiac rehab in [facility] which she has graduated from.  She feels clinically improved.  Her follow-up 2D echo performed 07/30/2024 revealed normal LV systolic function with a well-functioning aortic bioprosthesis.  This will be repeated on an annual basis.

## 2024-10-29 NOTE — Progress Notes (Signed)
 10/29/2024 Charlene Barnes   [birth year]  [id number]  Primary Physician Tisovec, Charlie ORN, MD Primary Cardiologist: Dorn JINNY Lesches MD GENI CODY MADEIRA, FSCAI  HPI:  Charlene Barnes is a 79 y.o.  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  She was referred by Dr. Tisovec at [facility] for evaluation of dyspnea on exertion.  I last saw her in the office 04/24/2024.SABRA  Her risk factors are positive for treated hypertension, untreated mild hyperlipidemia.  There is no family history of heart disease.  She is never had a heart attack or stroke.  She denies chest pain but over the last 6 months has noticed some increasing dyspnea on exertion for unclear reasons.   I obtained a coronary calcium score which is 0 and a 2D echo that showed normal LV systolic function with moderate to severe aortic insufficiency.  Based on this, I decided to proceed with outpatient TEE followed by right and left heart cath to define her anatomy and physiology in anticipation of potential aortic valve replacement.  Right and left heart cath performed/18/25 revealed normal coronary arteries and normal filling pressures.  I ultimately referred her to Dr. Lucas  for aortic valve replacement which was performed 06/07/2024 using a 23 mm Edwards Inspira's Resilia pericardial valve.  Postop course was unremarkable.  She did graduate from cardiac rehab in [facility].  She feels clinically improved.  Since I saw her 6 months ago she has done well.  She is now 5 months post aortic valve replacement.  She says she feels more energetic and denies dyspnea on exertion.  Her follow-up 2D echo performed 07/30/2024 showed normal LV systolic function with well-functioning aortic bioprosthesis.   Current Meds  Medication Sig   acetaminophen  (TYLENOL ) 650 MG CR tablet Take 1,300 mg by mouth  every 8 (eight) hours as needed for pain.   alendronate (FOSAMAX) 70 MG tablet Take 70 mg by mouth once a week.   amLODipine  (NORVASC ) 10 MG tablet Take 10 mg by mouth daily.   b complex vitamins capsule Take 1 capsule by mouth daily.   diclofenac (VOLTAREN) 75 MG EC tablet Take 75 mg by mouth as needed.   magnesium  gluconate (MAGONATE) 500 (27 Mg) MG TABS tablet Take 1 tablet (500 mg total) by mouth daily.   Multiple Vitamins-Minerals (MULTIVITAMIN WITH MINERALS) tablet Take 1 tablet by mouth daily.   PARoxetine  (PAXIL ) 20 MG tablet Take 20 mg by mouth daily.   pramipexole  (MIRAPEX ) 1.5 MG tablet Take 2.25 mg by mouth at bedtime.   traMADol  (ULTRAM ) 50 MG tablet Take 1 tablet (50 mg total) by mouth every 12 (twelve) hours as needed.   traZODone  (DESYREL ) 150 MG tablet Take 150 mg by mouth at bedtime as needed for sleep.     Allergies  Allergen Reactions   Latex Rash    Social History   Socioeconomic History   Marital status: Married    Spouse name: Bluford   Number of children: 2   Years of education: College   Highest education level: Not on file  Occupational History   Occupation: retired  Tobacco Use   Smoking status: Never   Smokeless tobacco: Not on file  Vaping Use   Vaping status: Never Used  Substance and Sexual Activity   Alcohol use: Yes    Comment: occasionally   Drug use: Not  Currently    Types: Marijuana    Comment: on occasion   Sexual activity: Not on file  Other Topics Concern   Not on file  Social History Narrative   Patient lives at home with spouse.Caffeine Use: 1-2 cups daily. Working retired 2013.    Social Drivers of Corporate Investment Banker Strain: Low Risk  (05/25/2022)   Received from [facility]   Overall Financial Resource Strain (CARDIA)    Difficulty of Paying Living Expenses: Not hard at all  Food Insecurity: Patient Unable To Answer (06/07/2024)   Hunger Vital Sign    Worried About Programme Researcher, Broadcasting/film/video in the Last Year: Patient unable  to answer    Ran Out of Food in the Last Year: Patient unable to answer  Transportation Needs: Patient Unable To Answer (06/07/2024)   PRAPARE - Transportation    Lack of Transportation (Medical): Patient unable to answer    Lack of Transportation (Non-Medical): Patient unable to answer  Physical Activity: Insufficiently Active (05/25/2022)   Received from [facility]   Exercise Vital Sign    On average, how many days per week do you engage in moderate to strenuous exercise (like a brisk walk)?: 2 days    On average, how many minutes do you engage in exercise at this level?: 60 min  Stress: No Stress Concern Present (05/25/2022)   Received from [facility] of Occup[facility] - Occupational Stress Questionnaire    Feeling of Stress : Only a little  Social Connections: Unknown (06/07/2024)   Social Connection and Isolation Panel    Frequency of Communication with Friends and Family: Patient unable to answer    Frequency of Social Gatherings with Friends and Family: Patient unable to answer    Attends Religious Services: Patient unable to answer    Active Member of Clubs or Organizations: Patient unable to answer    Attends Banker Meetings: Patient unable to answer    Marital Status: Married  Catering Manager Violence: Patient Unable To Answer (06/07/2024)   Humiliation, Afraid, Rape, and Kick questionnaire    Fear of Current or Ex-Partner: Patient unable to answer    Emotionally Abused: Patient unable to answer    Physically Abused: Patient unable to answer    Sexually Abused: Patient unable to answer     Review of Systems: General: negative for chills, fever, night sweats or weight changes.  Cardiovascular: negative for chest pain, dyspnea on exertion, edema, orthopnea, palpitations, paroxysmal nocturnal dyspnea or shortness of breath Dermatological: negative for rash Respiratory: negative for cough or wheezing Urologic: negative for  hematuria Abdominal: negative for nausea, vomiting, diarrhea, bright red blood per rectum, melena, or hematemesis Neurologic: negative for visual changes, syncope, or dizziness All other systems reviewed and are otherwise negative except as noted above.    Blood pressure 120/70, pulse (!) 56, height 5' 3.5 (1.613 m), weight 135 lb (61.2 kg).  General appearance: alert and no distress Neck: no adenopathy, no carotid bruit, no JVD, supple, symmetrical, trachea midline, and thyroid not enlarged, symmetric, no tenderness/mass/nodules Lungs: clear to auscultation bilaterally Heart: regular rate and rhythm, S1, S2 normal, no murmur, click, rub or gallop Extremities: extremities normal, atraumatic, no cyanosis or edema Pulses: 2+ and symmetric Skin: Skin color, texture, turgor normal. No rashes or lesions Neurologic: Grossly normal  EKG not performed today      ASSESSMENT AND PLAN:   Essential hypertension History of essential hypertension with blood pressure measured today  at 120/70.  She is on amlodipine .  Hyperlipidemia History of mild hyperlipidemia not on statin therapy with lipid profile performed 10/27/2023 revealing total cholesterol 216, LDL 133 and HDL 71.  This is followed by her PCP.  Given her coronary calcium score of 0 and clean coronary arteries on cath her LDL goal should be less than 100.  She may ultimately need to be on a low-dose statin therapy.  S/P AVR (aortic valve replacement) Ms Rump initially saw me because of severe aortic insufficiency.  This was confirmed on transesophageal echo.  I performed cardiac catheterization on her revealing clean coronary arteries and normal LVEDP.  She was symptomatic.  I referred her to Dr. Sherrine who performed aortic valve replacement 06/07/2024 with a 23 mm Edwards Inspiris Resilia pericardial valve.  Her postop course was uneventful.  She participated in cardiac rehab in [facility] which she has graduated from.  She feels clinically  improved.  Her follow-up 2D echo performed 07/30/2024 revealed normal LV systolic function with a well-functioning aortic bioprosthesis.  This will be repeated on an annual basis.     Dorn DOROTHA Lesches MD FACP,FACC,FAHA, [facility] 10/29/2024 10:13 AM

## 2024-10-29 NOTE — Patient Instructions (Signed)
 Medication Instructions:  Your physician recommends that you continue on your current medications as directed. Please refer to the Current Medication list given to you today.  *If you need a refill on your cardiac medications before your next appointment, please call your pharmacy*   Testing/Procedures: Your physician has requested that you have an echocardiogram in August 2026. Echocardiography is a painless test that uses sound waves to create images of your heart. It provides your doctor with information about the size and shape of your heart and how well your heart's chambers and valves are working. This procedure takes approximately one hour. There are no restrictions for this procedure. Please do NOT wear cologne, perfume, aftershave, or lotions (deodorant is allowed). Please arrive 15 minutes prior to your appointment time.  Please note: We ask at that you not bring children with you during ultrasound (echo/ vascular) testing. Due to room size and safety concerns, children are not allowed in the ultrasound rooms during exams. Our front office staff cannot provide observation of children in our lobby area while testing is being conducted. An adult accompanying a patient to their appointment will only be allowed in the ultrasound room at the discretion of the ultrasound technician under special circumstances. We apologize for any inconvenience.   Follow-Up: At [facility], you and your health needs are our priority.  As part of our continuing mission to provide you with exceptional heart care, our providers are all part of one team.  This team includes your primary Cardiologist (physician) and Advanced Practice Providers or APPs (Physician Assistants and Nurse Practitioners) who all work together to provide you with the care you need, when you need it.  Your next appointment:   1 year(s)  Provider:   Dorn Lesches, MD

## 2024-10-29 NOTE — Assessment & Plan Note (Signed)
 History of essential hypertension with blood pressure measured today at 120/70.  She is on amlodipine .

## 2024-10-29 NOTE — Assessment & Plan Note (Signed)
 History of mild hyperlipidemia not on statin therapy with lipid profile performed 10/27/2023 revealing total cholesterol 216, LDL 133 and HDL 71.  This is followed by her PCP.  Given her coronary calcium score of 0 and clean coronary arteries on cath her LDL goal should be less than 100.  She may ultimately need to be on a low-dose statin therapy.

## 2025-07-24 ENCOUNTER — Ambulatory Visit (HOSPITAL_COMMUNITY)
